# Patient Record
Sex: Female | Born: 1970 | Race: Black or African American | Hispanic: No | Marital: Single | Smoking: Never smoker
Health system: Southern US, Community
[De-identification: ages and names within clinical notes are randomized; demographics above are authoritative.]

## PROBLEM LIST (undated history)

## (undated) DIAGNOSIS — B009 Herpesviral infection, unspecified: Secondary | ICD-10-CM

## (undated) DIAGNOSIS — Z789 Other specified health status: Secondary | ICD-10-CM

## (undated) DIAGNOSIS — F329 Major depressive disorder, single episode, unspecified: Secondary | ICD-10-CM

## (undated) DIAGNOSIS — I10 Essential (primary) hypertension: Secondary | ICD-10-CM

## (undated) HISTORY — DX: Herpesviral infection, unspecified: B00.9

## (undated) HISTORY — PX: DIAGNOSTIC LAPAROSCOPY: SUR761

## (undated) HISTORY — DX: Essential (primary) hypertension: I10

## (undated) HISTORY — PX: TUBAL LIGATION: SHX77

## (undated) HISTORY — DX: Major depressive disorder, single episode, unspecified: F32.9

---

## 1997-10-29 ENCOUNTER — Ambulatory Visit (HOSPITAL_COMMUNITY): Admission: RE | Admit: 1997-10-29 | Discharge: 1997-10-29 | Payer: Self-pay | Admitting: *Deleted

## 2000-06-20 ENCOUNTER — Emergency Department (HOSPITAL_COMMUNITY): Admission: EM | Admit: 2000-06-20 | Discharge: 2000-06-20 | Payer: Self-pay | Admitting: Emergency Medicine

## 2001-12-21 ENCOUNTER — Emergency Department (HOSPITAL_COMMUNITY): Admission: EM | Admit: 2001-12-21 | Discharge: 2001-12-21 | Payer: Self-pay | Admitting: Emergency Medicine

## 2003-02-18 ENCOUNTER — Emergency Department (HOSPITAL_COMMUNITY): Admission: EM | Admit: 2003-02-18 | Discharge: 2003-02-19 | Payer: Self-pay | Admitting: Emergency Medicine

## 2003-02-19 ENCOUNTER — Encounter: Payer: Self-pay | Admitting: Emergency Medicine

## 2003-11-25 ENCOUNTER — Emergency Department (HOSPITAL_COMMUNITY): Admission: EM | Admit: 2003-11-25 | Discharge: 2003-11-26 | Payer: Self-pay | Admitting: Emergency Medicine

## 2004-03-17 ENCOUNTER — Emergency Department (HOSPITAL_COMMUNITY): Admission: EM | Admit: 2004-03-17 | Discharge: 2004-03-17 | Payer: Self-pay | Admitting: Emergency Medicine

## 2007-01-31 ENCOUNTER — Other Ambulatory Visit: Admission: RE | Admit: 2007-01-31 | Discharge: 2007-01-31 | Payer: Self-pay | Admitting: Obstetrics and Gynecology

## 2007-09-13 ENCOUNTER — Ambulatory Visit (HOSPITAL_COMMUNITY): Admission: RE | Admit: 2007-09-13 | Discharge: 2007-09-13 | Payer: Self-pay | Admitting: Obstetrics and Gynecology

## 2008-11-06 ENCOUNTER — Emergency Department (HOSPITAL_COMMUNITY): Admission: EM | Admit: 2008-11-06 | Discharge: 2008-11-06 | Payer: Self-pay | Admitting: Emergency Medicine

## 2010-01-13 ENCOUNTER — Ambulatory Visit (HOSPITAL_COMMUNITY): Admission: RE | Admit: 2010-01-13 | Discharge: 2010-01-13 | Payer: Self-pay | Admitting: Obstetrics

## 2011-04-04 ENCOUNTER — Encounter (HOSPITAL_COMMUNITY): Admission: RE | Payer: Self-pay | Source: Ambulatory Visit

## 2011-04-04 ENCOUNTER — Ambulatory Visit (HOSPITAL_COMMUNITY): Admission: RE | Admit: 2011-04-04 | Payer: Self-pay | Source: Ambulatory Visit | Admitting: Obstetrics & Gynecology

## 2011-04-04 SURGERY — ROBOTIC ASSISTED LAPAROSCOPIC VAGINAL HYSTERECTOMY LEVEL 1
Anesthesia: General

## 2013-10-16 ENCOUNTER — Ambulatory Visit: Payer: BC Managed Care – PPO | Admitting: Physician Assistant

## 2013-10-16 VITALS — BP 110/70 | HR 82 | Temp 98.6°F | Resp 16 | Ht 63.5 in | Wt 169.0 lb

## 2013-10-16 DIAGNOSIS — D259 Leiomyoma of uterus, unspecified: Secondary | ICD-10-CM

## 2013-10-16 DIAGNOSIS — B373 Candidiasis of vulva and vagina: Secondary | ICD-10-CM

## 2013-10-16 DIAGNOSIS — B3731 Acute candidiasis of vulva and vagina: Secondary | ICD-10-CM

## 2013-10-16 DIAGNOSIS — N898 Other specified noninflammatory disorders of vagina: Secondary | ICD-10-CM

## 2013-10-16 DIAGNOSIS — A6000 Herpesviral infection of urogenital system, unspecified: Secondary | ICD-10-CM

## 2013-10-16 LAB — POCT WET PREP WITH KOH
KOH Prep POC: POSITIVE
Trichomonas, UA: NEGATIVE
Yeast Wet Prep HPF POC: POSITIVE

## 2013-10-16 MED ORDER — FLUCONAZOLE 150 MG PO TABS: 150.0000 mg | ORAL_TABLET | Freq: Once | ORAL | Status: DC

## 2013-10-16 MED ORDER — METRONIDAZOLE 500 MG PO TABS: 500.0000 mg | ORAL_TABLET | Freq: Two times a day (BID) | ORAL | Status: DC

## 2013-10-16 MED ORDER — VALACYCLOVIR HCL 500 MG PO TABS: 500.0000 mg | ORAL_TABLET | Freq: Every day | ORAL | Status: DC

## 2013-10-16 NOTE — Progress Notes (Signed)
   Subjective:    Patient ID: Katie [name], female    DOB: Sep 06, 1971, 43 y.o.   MRN: [id number]  HPI 43 year old female presents for evaluation of vaginal discharge and vaginal pruritis x 2 weeks.  Had dental work done and was on a antibiotic. Has hx of yeast infections after antibiotic therapy.  Admits to intense pruritis and discomfort.  Denies abdominal pain, nausea, vomiting, dysuria, urinary frequency, fever, or chills.  She is sexually active but does not wish to be screened for STI's today.    Hx of uterine fibroids - would like to be referred to GYN for evaluation of hysterectomy.    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  Does not have any valtrex at home. Is considering suppressive therapy. Would like an rx for valtrex today.     Review of Systems  Constitutional: Negative for fever and chills.  Gastrointestinal: Negative for nausea, vomiting and abdominal pain.  Genitourinary: Positive for vaginal discharge and vaginal pain (burning/itching). Negative for dysuria, frequency, vaginal bleeding and genital sores.       Objective:   Physical Exam  Constitutional: She appears well-developed and well-nourished.  HENT:  Head: Normocephalic and atraumatic.  Right Ear: External ear normal.  Left Ear: External ear normal.  Eyes: Conjunctivae are normal.  Neck: Normal range of motion.  Cardiovascular: Normal rate, regular rhythm and normal heart sounds.   Pulmonary/Chest: Effort normal and breath sounds normal.  Abdominal: Soft. Bowel sounds are normal. There is no tenderness.  Large, palpable mass in LLQ consistent with large uterine fibroid  Genitourinary: Pelvic exam was performed with patient supine. There is no lesion on the right labia. There is no lesion on the left labia. Uterus is enlarged (large uterine fibroid left side). Cervix exhibits discharge (thick, white, clumpy). Cervix exhibits no motion tenderness and no friability. Right adnexum displays no mass, no  tenderness and no fullness. Left adnexum displays no mass, no tenderness and no fullness. No erythema around the vagina. Vaginal discharge (thick, white discharge at introitus) found.    Results for orders placed in visit on 10/16/13  POCT WET PREP WITH KOH      Result Value Range   Trichomonas, UA Negative     Clue Cells Wet Prep HPF POC 3-8     Epithelial Wet Prep HPF POC 3-10     Yeast Wet Prep HPF POC positive     Bacteria Wet Prep HPF POC 1+     RBC Wet Prep HPF POC 0-1     WBC Wet Prep HPF POC 0-3     KOH Prep POC Positive           Assessment & Plan:  Candida vaginitis - Plan: fluconazole (DIFLUCAN) 150 MG tablet  Leukorrhea, not specified as infective - Plan: POCT Wet Prep with KOH  Uterine fibroid - Plan: Ambulatory referral to Gynecology  Genital HSV - Plan: valACYclovir (VALTREX) 500 MG tablet  Will treat candidiasis with diflucan 150 mg po x 1 dose today. May repeat in 7 days Presence of clue cells indicates early BV - will treat with flagyl 500 mg bid x 7 days - no ETOH Referral placed to GYN for evaluation and treatment of large uterine fibroid Valtrex 500 mg daily if she would like to take for suppression. Increase to twice daily in presence of infection.

## 2013-10-16 NOTE — Patient Instructions (Signed)
May use OTC Gyne-Lotrimin for symptomatic relief

## 2014-01-18 ENCOUNTER — Ambulatory Visit: Payer: BC Managed Care – PPO | Admitting: Family Medicine

## 2014-01-18 ENCOUNTER — Encounter (HOSPITAL_COMMUNITY): Payer: Self-pay | Admitting: Emergency Medicine

## 2014-01-18 ENCOUNTER — Ambulatory Visit: Payer: BC Managed Care – PPO

## 2014-01-18 ENCOUNTER — Observation Stay (HOSPITAL_COMMUNITY)
Admission: EM | Admit: 2014-01-18 | Discharge: 2014-01-19 | Disposition: A | Discharge status: Home / Self Care | Payer: BC Managed Care – PPO | Attending: Family Medicine | Admitting: Family Medicine

## 2014-01-18 VITALS — BP 138/64 | HR 90 | Temp 98.2°F | Resp 18 | Ht 64.25 in | Wt 175.0 lb

## 2014-01-18 DIAGNOSIS — R5383 Other fatigue: Secondary | ICD-10-CM

## 2014-01-18 DIAGNOSIS — R5381 Other malaise: Secondary | ICD-10-CM | POA: Insufficient documentation

## 2014-01-18 DIAGNOSIS — N920 Excessive and frequent menstruation with regular cycle: Secondary | ICD-10-CM | POA: Insufficient documentation

## 2014-01-18 DIAGNOSIS — E876 Hypokalemia: Secondary | ICD-10-CM

## 2014-01-18 DIAGNOSIS — M79609 Pain in unspecified limb: Secondary | ICD-10-CM

## 2014-01-18 DIAGNOSIS — M79602 Pain in left arm: Secondary | ICD-10-CM

## 2014-01-18 DIAGNOSIS — D649 Anemia, unspecified: Secondary | ICD-10-CM | POA: Diagnosis present

## 2014-01-18 DIAGNOSIS — R079 Chest pain, unspecified: Secondary | ICD-10-CM

## 2014-01-18 DIAGNOSIS — N83209 Unspecified ovarian cyst, unspecified side: Secondary | ICD-10-CM | POA: Insufficient documentation

## 2014-01-18 DIAGNOSIS — M542 Cervicalgia: Secondary | ICD-10-CM

## 2014-01-18 DIAGNOSIS — D251 Intramural leiomyoma of uterus: Secondary | ICD-10-CM | POA: Insufficient documentation

## 2014-01-18 DIAGNOSIS — D509 Iron deficiency anemia, unspecified: Principal | ICD-10-CM | POA: Insufficient documentation

## 2014-01-18 DIAGNOSIS — R0789 Other chest pain: Secondary | ICD-10-CM | POA: Insufficient documentation

## 2014-01-18 DIAGNOSIS — D252 Subserosal leiomyoma of uterus: Secondary | ICD-10-CM | POA: Insufficient documentation

## 2014-01-18 LAB — POCT CBC
Granulocyte percent: 48.8 %G (ref 37–80)
HCT, POC: 22.4 % — AB (ref 37.7–47.9)
Hemoglobin: 6.3 g/dL — AB (ref 12.2–16.2)
Lymph, poc: 2.5 (ref 0.6–3.4)
MCH, POC: 14.7 pg — AB (ref 27–31.2)
MCHC: 28.1 g/dL — AB (ref 31.8–35.4)
MCV: 52 fL — AB (ref 80–97)
MID (cbc): 0.5 (ref 0–0.9)
POC Granulocyte: 2.9 (ref 2–6.9)
POC LYMPH PERCENT: 42.5 %L (ref 10–50)
POC MID %: 8.7 %M (ref 0–12)
Platelet Count, POC: 687 10*3/uL — AB (ref 142–424)
RBC: 4.3 M/uL (ref 4.04–5.48)
RDW, POC: 23.1 %
WBC: 5.9 10*3/uL (ref 4.6–10.2)

## 2014-01-18 LAB — BASIC METABOLIC PANEL
BUN: 9 mg/dL (ref 6–23)
CO2: 23 mEq/L (ref 19–32)
Calcium: 9 mg/dL (ref 8.4–10.5)
Chloride: 104 mEq/L (ref 96–112)
Creatinine, Ser: 0.64 mg/dL (ref 0.50–1.10)
GFR calc Af Amer: >90 mL/min (ref >90)
GFR calc non Af Amer: >90 mL/min (ref >90)
Glucose, Bld: 83 mg/dL (ref 70–99)
Potassium: 3.6 mEq/L — ABNORMAL LOW (ref 3.7–5.3)
Sodium: 139 mEq/L (ref 137–147)

## 2014-01-18 LAB — CBC WITH DIFFERENTIAL/PLATELET
Basophils Absolute: 0.1 10*3/uL (ref 0.0–0.1)
Basophils Relative: 1 % (ref 0–1)
Eosinophils Absolute: 0.1 10*3/uL (ref 0.0–0.7)
Eosinophils Relative: 3 % (ref 0–5)
HCT: 20.5 % — ABNORMAL LOW (ref 36.0–46.0)
Hemoglobin: 5.5 g/dL — CL (ref 12.0–15.0)
Lymphocytes Relative: 40 % (ref 12–46)
Lymphs Abs: 1.7 10*3/uL (ref 0.7–4.0)
MCH: 14.8 pg — ABNORMAL LOW (ref 26.0–34.0)
MCHC: 26.8 g/dL — ABNORMAL LOW (ref 30.0–36.0)
MCV: 55.3 fL — ABNORMAL LOW (ref 78.0–100.0)
Monocytes Absolute: 0.3 10*3/uL (ref 0.1–1.0)
Monocytes Relative: 8 % (ref 3–12)
Neutro Abs: 2 10*3/uL (ref 1.7–7.7)
Neutrophils Relative %: 47 % (ref 43–77)
Platelets: 384 10*3/uL (ref 150–400)
RBC: 3.71 MIL/uL — ABNORMAL LOW (ref 3.87–5.11)
RDW: 21.9 % — ABNORMAL HIGH (ref 11.5–15.5)
WBC: 4.2 10*3/uL (ref 4.0–10.5)

## 2014-01-18 LAB — URINALYSIS, ROUTINE W REFLEX MICROSCOPIC
Bilirubin Urine: NEGATIVE
Glucose, UA: NEGATIVE mg/dL
Hgb urine dipstick: NEGATIVE
Ketones, ur: NEGATIVE mg/dL
Leukocytes, UA: NEGATIVE
Nitrite: NEGATIVE
Protein, ur: NEGATIVE mg/dL
Specific Gravity, Urine: 1.03 (ref 1.005–1.030)
Urobilinogen, UA: 1 mg/dL (ref 0.0–1.0)
pH: 5.5 (ref 5.0–8.0)

## 2014-01-18 LAB — I-STAT TROPONIN, ED: Troponin i, poc: 0 ng/mL (ref 0.00–0.08)

## 2014-01-18 LAB — PREPARE RBC (CROSSMATCH)

## 2014-01-18 LAB — PROTIME-INR
INR: 1.07 (ref 0.00–1.49)
Prothrombin Time: 13.7 seconds (ref 11.6–15.2)

## 2014-01-18 LAB — POCT SEDIMENTATION RATE: POCT SED RATE: 40 mm/hr — AB (ref 0–22)

## 2014-01-18 LAB — RETICULOCYTES
RBC.: 4.27 MIL/uL (ref 3.87–5.11)
Retic Count, Absolute: 68.3 10*3/uL (ref 19.0–186.0)
Retic Ct Pct: 1.6 % (ref 0.4–3.1)

## 2014-01-18 LAB — APTT: aPTT: 29 seconds (ref 24–37)

## 2014-01-18 LAB — ABO/RH: ABO/RH(D): O POS

## 2014-01-18 LAB — POC URINE PREG, ED: Preg Test, Ur: NEGATIVE

## 2014-01-18 LAB — POC OCCULT BLOOD, ED: Fecal Occult Bld: NEGATIVE

## 2014-01-18 MED ORDER — FOLIC ACID 1 MG PO TABS
1.0000 mg | ORAL_TABLET | Freq: Every day | ORAL | Status: DC
  Administered 2014-01-18 – 2014-01-19 (×2): 1 mg via ORAL
  Filled 2014-01-18 (×2): qty 1

## 2014-01-18 MED ORDER — VITAMIN B-1 100 MG PO TABS
100.0000 mg | ORAL_TABLET | Freq: Every day | ORAL | Status: DC
  Administered 2014-01-18 – 2014-01-19 (×2): 100 mg via ORAL
  Filled 2014-01-18 (×2): qty 1

## 2014-01-18 MED ORDER — ONDANSETRON HCL 4 MG PO TABS: 4.0000 mg | ORAL_TABLET | Freq: Four times a day (QID) | ORAL | Status: DC | PRN

## 2014-01-18 MED ORDER — ONDANSETRON HCL 4 MG/2ML IJ SOLN: 4.0000 mg | Freq: Four times a day (QID) | INTRAMUSCULAR | Status: DC | PRN

## 2014-01-18 MED ORDER — ADULT MULTIVITAMIN W/MINERALS CH
1.0000 | ORAL_TABLET | Freq: Every day | ORAL | Status: DC
  Administered 2014-01-18 – 2014-01-19 (×2): 1 via ORAL
  Filled 2014-01-18 (×2): qty 1

## 2014-01-18 MED ORDER — POLYETHYLENE GLYCOL 3350 17 G PO PACK
17.0000 g | PACK | Freq: Every day | ORAL | Status: DC | PRN
  Filled 2014-01-18: qty 1

## 2014-01-18 NOTE — ED Notes (Signed)
Pt. Stated, I went to an [facility] because I had some arm pain and they did EKG, xrays and blood work and told me I needed to come here because my Blood work was abnormal that I needed to get it checked out.blood work showed Hgb. 6.3

## 2014-01-18 NOTE — ED Provider Notes (Signed)
Medical screening examination/treatment/procedure(s) were performed by non-physician practitioner and as supervising physician I was immediately available for consultation/collaboration.   EKG Interpretation None        Osvaldo Shipper, MD 01/18/14 1932

## 2014-01-18 NOTE — ED Provider Notes (Signed)
Medical screening examination/treatment/procedure(s) were conducted as a shared visit with non-physician practitioner(s) and myself.  I personally evaluated the patient during the encounter.   EKG Interpretation None      Patient here with fatigue - blood work from urgent care showed anemia. Hx of heavy periods. Here with stable vitals, benign exam. Admitted.  Please excuse prior note, I did see patient and examine her myself.  Osvaldo Shipper, MD 01/18/14 [phone]

## 2014-01-18 NOTE — H&P (Signed)
[facility] Admission History and Physical Service Pager: [phone]  Patient name: Katie [name] Medical record number: [id number] Date of birth: 1971/06/11 Age: 43 y.o. Gender: female  Primary Care Provider: No PCP Per Patient Consultants: none Code Status: Full   Chief Complaint: weakness/anemia   Assessment and Plan: Katie [name] is a 43 y.o. female presenting with weakness found to be anemic Hgb 5.5. PMH is significant for HSV2 and anemia.   #Microcytic anemia/Fibroid uterus: patient with weakness and Hgb 5.5. No known history of prior drop in hgb. History of fibroids and heavy menses. LMP a few weeks ago. Most recent transvaginal US (01/13/2010) showing multiple fibroids with the largest being 7.1 x 6.1 x 7.1 cm. She is without any hemoptysis, hematochezia, melena or chronic NSAID use. No increase in reticulocytes. FOBT (-) and no tachycardia. Urine pregnancy (-) and UA normal. istat troponin (-). Sed rate 40. Anemia most likely related to uterine bleeding.  - admitted to telemetry, Dr. Gwendlyn Deutscher attending  - Anemia panel: pending (acquired prior to transfusion)  - 2 U PRBCs  - type and screen  - post transfusion CBC  - PT/INR - APTT  - transvaginal US, can consider pelvis CT if needed for better visualization of fibroids - thiamine, B12, MVI  - Needs outpatient gyn appointment to discuss treatment for fibroids  #Hypokalemia: noticed on BMP. Will follow for now.  - kdur 40 mEq x 1  - BMP Am   #Chest pain: atypical chest pain. Improved since admission. istat troponin (-) and EKG NSR.  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  - valcyclovir for suppression.   FEN/GI: regular diet, saline lock  Prophylaxis: SCD's   Disposition: admitted to family medicine teaching service for anemia, Dr. Gwendlyn Deutscher attending   History of Present Illness: Katie [name] is a 43 y.o. female presenting with weakness  and chest pain.  She was has been feeling weaker than normal but had some chest pain today and went to [facility] urgent care. She was found to be anemic to 6.3 and sent to [facility] ED.  She has a history of fibroids.  Her last LMP ended this past Wednesday and it usually lasts 8-9 days.  She will have to wear 4-5 heavy pads per day during her cycle.  She started having menses at age 46.  Her cycle started getting heavier when she was in her thirties.  She denies any bleeding per rectum and not spitting or vomiting up blood.  She was being evaluated to have a robotic hysterectomy but didn't want a robotic procedure.  She has weakness and fatigue at baseline.   She was having pain in left arm and shoulder.  Chest pain started last night. It started out as shooting pain in leg and left side of shoulder and down her arm. Her left arm was numb and tingly. The pain didn't stop throughout this morning. The pain was not related to pain or exertion.  The chest pain like a heavy pressure.  She denies nausea or diaphoresis.  She has had some nights with night sweats for the past 6 monthns.   ED course: UA only cloudy and CXR with no active disease.   Review Of Systems: Per HPI with the following additions: See HPI  Otherwise 12 point review of systems was performed and was unremarkable.  Patient Active Problem List   Diagnosis Date Noted  . Anemia 01/18/2014  Past Medical History: Past Medical History  Diagnosis Date  . Herpes    Past Surgical History: Past Surgical History  Procedure Laterality Date  . Tubal ligation     Social History: History  Substance Use Topics  . Smoking status: Never Smoker   . Smokeless tobacco: Not on file  . Alcohol Use: Yes   Additional social history: none Please also refer to relevant sections of EMR.  Family History: Family History  Problem Relation Age of Onset  . Cancer Mother   . Cancer Father   . Cancer Brother   . Cancer Paternal Grandmother    Allergies and  Medications: No Known Allergies No current facility-administered medications on file prior to encounter.   No current outpatient prescriptions on file prior to encounter.    Objective: BP 124/66  Pulse 88  Temp(Src) 98.3 F (36.8 C) (Oral)  Resp 21  SpO2 100%  LMP 12/22/2013 Exam: General: NAD, cooperative with exam, African [place] female  HEENT: oropharynx clear, PERRL, EOMI, pale conjunctiva  Cardiovascular: S1S2, RRR, no murmurs, rubs or gallops  Respiratory: Clear breath sounds , no extra effort of breathing, no wheezing  MSK: no pain upon palpation  Abdomen: soft, NTND, +BS, no HSM, enlarged uterus palpated with abnormal contours due to fibroids Extremities: moves all freely, delayed capillary refill  Skin: no rashes, pallor  Neuro: alert and oriented, no gross deficits.   Labs and Imaging: CBC BMET   Recent Labs Lab 01/18/14 1446  WBC 4.2  HGB 5.5*  HCT 20.5*  PLT 384    Recent Labs Lab 01/18/14 1446  NA 139  K 3.6*  CL 104  CO2 23  BUN 9  CREATININE 0.64  GLUCOSE 83  CALCIUM 9.0     Urinalysis    Component Value Date/Time   COLORURINE YELLOW 01/18/2014 1521   APPEARANCEUR CLOUDY* 01/18/2014 1521   LABSPEC 1.030 01/18/2014 1521   PHURINE 5.5 01/18/2014 1521   GLUCOSEU NEGATIVE 01/18/2014 1521   [place] 01/18/2014 1521   [place] 01/18/2014 1521   [place] 01/18/2014 1521   PROTEINUR NEGATIVE 01/18/2014 1521   UROBILINOGEN 1.0 01/18/2014 1521   NITRITE NEGATIVE 01/18/2014 1521   LEUKOCYTESUR NEGATIVE 01/18/2014 1521   Urine culture: pending   Urine preg (-)   EKG: NSR   CXR: No acute cardiopulmonary disease.    Results for TRACEY, HERMANCE (MRN [id number]) as of 01/18/2014 17:51  Ref. Range 01/18/2014 15:27  RBC. Latest Range: 3.87-5.11 MIL/uL 4.27  Retic Ct Pct Latest Range: 0.4-3.1 % 1.6  Retic Count, Manual Latest Range: 19.0-186.0 K/uL 68.3    Rosemarie Ax, MD 01/18/2014, 4:51 PM PGY-1, [place] Intern pager: [phone], text pages welcome  PGY-3 Addendum: Patient seen and examined. I agree with Dr. Doristine Locks note above with additional changes noted in purple. Will begin blood transfusion 2U PRBC and monitor H/H for response. Patient agrees with plan.  Hayzel Ruberg M. Agatha Duplechain, M.D. 01/18/2014 7:07 PM

## 2014-01-18 NOTE — Patient Instructions (Signed)
Go directly to [facility] Emergency room for evaluation of your low blood count (anemia), and further evaluation of your left arm/neck/upper chest pain. After their treatment, if follow up needed with primary care - I am here this week. Return to the clinic or go to the nearest emergency room if any of your symptoms worsen or new symptoms occur.

## 2014-01-18 NOTE — ED Provider Notes (Signed)
CSN: [id number]     Arrival date & time 01/18/14  48 History   First MD Initiated Contact with Patient 01/18/14 1501     Chief Complaint  Patient presents with  . Weakness     (Consider location/radiation/quality/duration/timing/severity/associated sxs/prior Treatment) HPI Comments: Patient is a 43 year old female with history of fibroids who presents today from [place]. She was evaluated there for generalized weakness and chest pain. She states that her weakness has been gradually worsening over the past few months. She has had worsening DOE for the past few months as well. She complains of foot and chest pain which started after wearing wedge shoes. She normally wears flat shoes, but 2 days ago wore the wedges. The next morning she began to have shooting pains in her foot that lasted minutes at a time, worse with palpation and movement. She currently is experiencing chest tightness. That has been constant since yesterday. No history of cardiac disease. No family history of early heart disease. No hypertension, hyperlipidemia. She has never smoked. She has never required a transfusion in the past.   Patient is a 43 y.o. female presenting with weakness. The history is provided by the patient. No language interpreter was used.  Weakness Associated symptoms include chest pain, myalgias and weakness. Pertinent negatives include no abdominal pain, chills, fever, nausea or vomiting.    Past Medical History  Diagnosis Date  . Herpes    Past Surgical History  Procedure Laterality Date  . Tubal ligation     Family History  Problem Relation Age of Onset  . Cancer Mother   . Cancer Father   . Cancer Brother   . Cancer Paternal Grandmother    History  Substance Use Topics  . Smoking status: Never Smoker   . Smokeless tobacco: Not on file  . Alcohol Use: Yes   OB History   Grav Para Term Preterm Abortions TAB SAB Ect Mult Living                 Review of Systems  Constitutional:  Negative for fever and chills.  Respiratory: Positive for chest tightness and shortness of breath.   Cardiovascular: Positive for chest pain.  Gastrointestinal: Negative for nausea, vomiting and abdominal pain.  Genitourinary: Negative for vaginal bleeding.  Musculoskeletal: Positive for myalgias.  Neurological: Positive for weakness.  All other systems reviewed and are negative.     Allergies  Review of patient's allergies indicates no known allergies.  Home Medications   Prior to Admission medications   Medication Sig Start Date End Date Taking? Authorizing Provider  Cyanocobalamin (VITAMIN B 12 PO) Take 2 tablets by mouth daily.   Yes Historical Provider, MD  naproxen sodium (ANAPROX) 220 MG tablet Take 440 mg by mouth 3 (three) times daily as needed.   Yes Historical Provider, MD   BP 129/65  Pulse 86  Temp(Src) 98.3 F (36.8 C) (Oral)  Resp 18  SpO2 96%  LMP 12/22/2013 Physical Exam  Nursing note and vitals reviewed. Constitutional: She is oriented to person, place, and time. She appears well-developed and well-nourished. No distress.  HENT:  Head: Normocephalic and atraumatic.  Right Ear: External ear normal.  Left Ear: External ear normal.  Nose: Nose normal.  Mouth/Throat: Oropharynx is clear and moist.  Eyes: Conjunctivae are normal.  Neck: Normal range of motion.  Cardiovascular: Normal rate, regular rhythm and normal heart sounds.   Pulmonary/Chest: Effort normal and breath sounds normal. No stridor. No respiratory distress. She has  no wheezes. She has no rales.    Abdominal: Soft. She exhibits no distension.  Musculoskeletal: Normal range of motion.  Neurological: She is alert and oriented to person, place, and time. She has normal strength.  Skin: Skin is warm and dry. She is not diaphoretic. No erythema. There is pallor.  Psychiatric: She has a normal mood and affect. Her behavior is normal.    ED Course  Procedures (including critical care  time) Labs Review Labs Reviewed  CBC WITH DIFFERENTIAL - Abnormal; Notable for the following:    RBC 3.71 (*)    Hemoglobin 5.5 (*)    HCT 20.5 (*)    MCV 55.3 (*)    MCH 14.8 (*)    MCHC 26.8 (*)    RDW 21.9 (*)    All other components within normal limits  BASIC METABOLIC PANEL - Abnormal; Notable for the following:    Potassium 3.6 (*)    All other components within normal limits  URINALYSIS, ROUTINE W REFLEX MICROSCOPIC - Abnormal; Notable for the following:    APPearance CLOUDY (*)    All other components within normal limits  URINE CULTURE  RETICULOCYTES  OCCULT BLOOD X 1 CARD TO LAB, STOOL  VITAMIN B12  FOLATE  IRON AND TIBC  FERRITIN  PROTIME-INR  APTT  CBC  BASIC METABOLIC PANEL  I-STAT TROPOININ, ED  POC URINE PREG, ED  POC OCCULT BLOOD, ED  PREPARE RBC (CROSSMATCH)  TYPE AND SCREEN  ABO/RH    Imaging Review Dg Chest 2 View  01/18/2014   CLINICAL DATA:  upper chest to L arm pain.  EXAM: CHEST - 2 VIEW  COMPARISON:  None available  FINDINGS: Lungs are clear. Heart size and mediastinal contours are within normal limits. No effusion. Visualized skeletal structures are unremarkable.  IMPRESSION: No acute cardiopulmonary disease.   Electronically Signed   By: Arne Cleveland M.D.   On: 01/18/2014 13:07   Dg Cervical Spine 2 Or 3 Views  01/18/2014   CLINICAL DATA:  L arm pain, neck pain on left.  EXAM: CERVICAL SPINE - 2-3 VIEW  COMPARISON:  None.  FINDINGS: Reversal of the normal cervical lordosis. No prevertebral soft tissue swelling. Negative for fracture. Mild narrowing of the C6-7 interspace. Anterior and posterior endplate spurring D9-I3.  IMPRESSION: 1. Negative for fracture or other acute bone abnormality. 2. 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 3. Loss of the normal cervical spine lordosis, which may be secondary to positioning, spasm, or soft tissue injury.   Electronically Signed   By: Arne Cleveland M.D.   On: 01/18/2014 13:08     EKG  Interpretation None      MDM   Final diagnoses:  Anemia    Patient presents to ED for evaluation of anemia from [facility]. Patient with hemoglobin of 5.5. I think likely source is vaginal bleeding from known fibroids, but anemia panel ordered. She has never had this issue in the past. She has never required a transfusion in the past. Hemoccult negative. Patient is hemodynamically stable. Appropriate for tele. Admission is appreciated. Dr. Mingo Amber evaluated patient and agrees with plan. Patient / Family / Caregiver informed of clinical course, understand medical decision-making process, and agree with plan.     Elwyn Lade, PA-C 01/18/14 1806

## 2014-01-18 NOTE — H&P (Signed)
FMTS ATTENDING ADMISSION NOTE Katie Eniola,MD I  have seen and examined this patient, reviewed their chart. I have discussed this patient with the resident. I agree with the resident's findings, assessment and care plan.  43 Y/O F with PMX of large fibroid,menorraghia, was sent to the hospital from urgent care due to hx of left sided chest pain which started last night radiating to her left arm. She stated the pain started from her left feet like a cramp and while trying to massage her feet the pain shoot up to her chest and arm. Pain has improved now. She denies any dizziness but has chronic fatigue for months which she attributed to stress from work. Patient endorsed heavy menstrual bleeding with clots every months and her menses last 8-9 days every months, this she attributed to her large fibroid. She denies any GI bleed,no blood in stool. No other GI symptoms. She denies anemia but due to her heavy monthly menstrual bleed she started on ferrous sulphate on and off. She endorsed family hx of leukemia in her mother and lymphoma in her father.  No current facility-administered medications on file prior to encounter.   No current outpatient prescriptions on file prior to encounter.   Past Medical History  Diagnosis Date  . Herpes    Filed Vitals:   01/18/14 1522 01/18/14 1744 01/18/14 1834 01/18/14 1903  BP: 124/66 116/59 119/76 108/71  Pulse: 88 72 85 74  Temp:  98.2 F (36.8 C) 98 F (36.7 C) 98.4 F (36.9 C)  TempSrc:  Oral Oral Oral  Resp: 21 16 16 16   SpO2: 100% 100% 99% 100%   Exam: Gen: Calm in bed,in no distress. HEENT: + conjunctival palor,EOMI, PERRLA. Resp:Air entry equal B/L,no wheezing. CV: S1 S2 normal,no murmur. Abdomen: NT/ND,BS+, palpable suprapubic mass more located on the right ( fibroid) Ext: No edema.  A/P: 43 Y/O female with         1. severe anemia secondary to menorrhagia from fibroid.            No acute bleed, this is likely chronic hence patient  presenting with fatigue and no fainting.            Anemia panel.            Type and screen and transfuse 2 unit PRBC.            Post transfusion CBC            Irone supplement recommended.       2. Chest pain likely due to cardia demand from severe anemia.          Symptom gradually resolving.          EKG non specific t wave changes.          iStat troponin negative.          Monitor cardiac activity on telemetry.          Cycle troponin.          Repeat EKG in am.          Consider cards consult depending on result and symptoms.  3. Fibroid: Contributing her anemia.     Pelvic U/S to assess size or other uterine pathology that can potentially cause bleeding.     Patient will need Gyn follow up upon d/c from the hospital.  4. Electrolyte inbalance, replace electrolyte and monitor.

## 2014-01-18 NOTE — Progress Notes (Addendum)
Subjective:  This chart was scribed for Katie Ray, MD by Roxan Diesel, Scribe.  This patient was seen in [place] [facility] and the patient's care was started at 11:43 AM.   Patient ID: Katie [name], female    DOB: 12-Dec-1970, 43 y.o.   MRN: [id number]  HPI  Katie [name] is a 43 y.o. female PCP: No PCP Per Patient   Pt presents with a complaint of left-sided chest and left arm pain that began last night at 12 AM.  Pt reports she has had some numbness and tingling in her feet for the past 2 days since she "wore the wrong shoes" and last night she was stretching her left foot when she developed sudden onset of "sharp" pain shooting up her left leg, left side and chest, and down her left arm.  Her leg pain has since resolved but she has continued to have constant "sharp" pain and a feeling of "heaviness" in her left arm, shoulder and frontal left chest area.  She also reports the left side of her neck feels "stiff and tight."  She states the pain shoots down her arm into the fingers but denies numbness in arm or finger tips.  She denies associated nausea, vomiting, SOB, headache, or diaphoresis.  She has attempted to treat pain with 3 Aleve tablets (last taken 1 hour ago), with mild relief.  Pt admits to prior h/o occasional neck pain but denies prior h/o chest pain or pressure.  She notes that for the past several months she has occasionally become SOB when going up steps, "but that's just because I'm out of shape."  She denies associated CP or pressure at those times and denies any SOB associated with her current pain.  She does report recent increased stress due to working 2 jobs and "trying to maintain everything."  She reports occasional dysphoric mood and mild depression.  She declines to speak with a counselor.  She denies recent long-distance travel.  She denies recent calf swelling or pain.  She denies h/o HTN.  She does not smoke.  She denies family h/o heart disease.    There are  no active problems to display for this patient.   Past Medical History  Diagnosis Date  . Herpes     Past Surgical History  Procedure Laterality Date  . Tubal ligation      No Known Allergies  Prior to Admission medications   Medication Sig Start Date End Date Taking? Authorizing Provider  valACYclovir (VALTREX) 500 MG tablet Take 1 tablet (500 mg total) by mouth daily. Increase to twice daily if outbreak occurs 10/16/13   Collene Leyden, PA-C    History   Social History  . Marital Status: Single    Spouse Name: N/A    Number of Children: N/A  . Years of Education: N/A   Occupational History  . Not on file.   Social History Main Topics  . Smoking status: Never Smoker   . Smokeless tobacco: Not on file  . Alcohol Use: Not on file  . Drug Use: Not on file  . Sexual Activity: Not on file   Other Topics Concern  . Not on file   Social History Narrative  . No narrative on file     Review of Systems  Constitutional: Positive for fatigue. Negative for diaphoresis.  Respiratory: Positive for shortness of breath (with going up steps over the past few months, none with current pain).   Cardiovascular: Positive  for chest pain. Negative for leg swelling.  Gastrointestinal: Negative for nausea and vomiting.  Musculoskeletal: Positive for neck pain.       Left arm pain Left leg pain (last night, since resolved) Foot pain No calf pain  Neurological: Positive for light-headedness. Negative for numbness and headaches.  Psychiatric/Behavioral: Positive for dysphoric mood.        Objective:   Physical Exam  Vitals reviewed. Constitutional: She is oriented to person, place, and time. She appears well-developed and well-nourished.  HENT:  Head: Normocephalic and atraumatic.  Eyes: Conjunctivae and EOM are normal. Pupils are equal, round, and reactive to light.  Neck: Carotid bruit is not present.  Cardiovascular: Normal rate, regular rhythm, normal heart sounds and  intact distal pulses.   Pulmonary/Chest: Effort normal and breath sounds normal.  Abdominal: Soft. She exhibits no pulsatile midline mass. There is no tenderness.  Musculoskeletal:  Pain is somewhat reproducible with pressure along the left arm, and somewhat along the left chest and upper back and neck area.  Unchanged with ROM of the neck.  Neurological: She is alert and oriented to person, place, and time. No cranial nerve deficit.  Reflex Scores:      Tricep reflexes are 2+ on the right side and 2+ on the left side.      Bicep reflexes are 2+ on the right side and 2+ on the left side.      Brachioradialis reflexes are 2+ on the right side and 2+ on the left side. Negative Tinel's at the neck Strength equal in right and left arms  Skin: Skin is warm and dry.  Psychiatric: She has a normal mood and affect. Her behavior is normal.     Filed Vitals:   01/18/14 1122  BP: 138/64  Pulse: 90  Temp: 98.2 F (36.8 C)  TempSrc: Oral  Resp: 18  Height: 5' 4.25" (1.632 m)  Weight: 175 lb (79.379 kg)  SpO2: 99%  Body mass index is 29.8 kg/(m^2).   Results for orders placed in visit on 01/18/14  POCT CBC      Result Value Ref Range   WBC 5.9  4.6 - 10.2 K/uL   Lymph, poc 2.5  0.6 - 3.4   POC LYMPH PERCENT 42.5  10 - 50 %L   MID (cbc) 0.5  0 - 0.9   POC MID % 8.7  0 - 12 %M   POC Granulocyte 2.9  2 - 6.9   Granulocyte percent 48.8  37 - 80 %G   RBC 4.30  4.04 - 5.48 M/uL   Hemoglobin 6.3 (*) 12.2 - 16.2 g/dL   HCT, POC 22.4 (*) 37.7 - 47.9 %   MCV 52.0 (*) 80 - 97 fL   MCH, POC 14.7 (*) 27 - 31.2 pg   MCHC 28.1 (*) 31.8 - 35.4 g/dL   RDW, POC 23.1     Platelet Count, POC 687 (*) 142 - 424 K/uL   MPV    0 - 99.8 fL   Reviewed above with patient, hx of fibroids, clots and heavy periods.  LMP 4/26- 5/5. 1 week menses usually, no known hx of anemia. Some fatigue over time with work and shortness of breath with walking stairs, but no recent changes. Denies near syncope or increased  lightheadedness.  Hx low vit D in past - no recent meds.  Not told anemic or iron deficient in past.   [facility] reading (PRIMARY) by  Dr. 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      Assessment & Plan:  Katie [name] is a 43 y.o. female Chest pain Neck pain on left side Left arm pain - Plan: DG Cervical Spine 2 or 3 views  -some reproducibility with exam, and notable ddd of cervical spine. No known cardiac risk factors, but with anemia as below, will have further eval in emergency room today.  Discussed EMS transport, but this was declined and friend will be driving to ER. Charge nurse advised.   Anemia - microcytic, suspect iron deficiency from menstrual losses. Likely cause of fatigue and dyspnea with stairs. eval in ER for possible transfusion as above.   Other malaise and fatigue - as above.   Also some stress component likely with two jobs, and some anhedonia noted. Plan to discuss this further at follow up - recommended recheck with me this week after hospital eval.    RTC precautions.   No orders of the defined types were placed in this encounter.   Patient Instructions  Go directly to [facility] Emergency room for evaluation of your low blood count (anemia), and further evaluation of your left arm/neck/upper chest pain. After their treatment, if follow up needed with primary care - I am here this week. Return to the clinic or go to the nearest emergency room if any of your symptoms worsen or new symptoms occur.      I personally performed the services described in this documentation, which was scribed in my presence. The recorded information has been reviewed and considered, and addended by me as needed.

## 2014-01-19 ENCOUNTER — Observation Stay (HOSPITAL_COMMUNITY): Payer: BC Managed Care – PPO

## 2014-01-19 LAB — IRON AND TIBC
Iron: 14 ug/dL — ABNORMAL LOW (ref 42–135)
UIBC: >575 ug/dL — ABNORMAL HIGH (ref 125–400)

## 2014-01-19 LAB — URINE CULTURE: Colony Count: >=100000

## 2014-01-19 LAB — CBC
HCT: 28.6 % — ABNORMAL LOW (ref 36.0–46.0)
Hemoglobin: 8.4 g/dL — ABNORMAL LOW (ref 12.0–15.0)
MCH: 18 pg — ABNORMAL LOW (ref 26.0–34.0)
MCHC: 29.4 g/dL — ABNORMAL LOW (ref 30.0–36.0)
MCV: 61.2 fL — ABNORMAL LOW (ref 78.0–100.0)
Platelets: 472 10*3/uL — ABNORMAL HIGH (ref 150–400)
RBC: 4.67 MIL/uL (ref 3.87–5.11)
RDW: 29.5 % — ABNORMAL HIGH (ref 11.5–15.5)
WBC: 4 10*3/uL (ref 4.0–10.5)

## 2014-01-19 LAB — FERRITIN: Ferritin: <1 ng/mL — ABNORMAL LOW (ref 10–291)

## 2014-01-19 LAB — BASIC METABOLIC PANEL
BUN: 9 mg/dL (ref 6–23)
CO2: 20 mEq/L (ref 19–32)
Calcium: 9.1 mg/dL (ref 8.4–10.5)
Chloride: 104 mEq/L (ref 96–112)
Creatinine, Ser: 0.64 mg/dL (ref 0.50–1.10)
GFR calc Af Amer: >90 mL/min (ref >90)
GFR calc non Af Amer: >90 mL/min (ref >90)
Glucose, Bld: 90 mg/dL (ref 70–99)
Potassium: 4.4 mEq/L (ref 3.7–5.3)
Sodium: 139 mEq/L (ref 137–147)

## 2014-01-19 LAB — VITAMIN B12: Vitamin B-12: 886 pg/mL (ref 211–911)

## 2014-01-19 LAB — FOLATE: Folate: 15 ng/mL

## 2014-01-19 LAB — TROPONIN I
Troponin I: <0.3 ng/mL (ref <0.30)
Troponin I: <0.3 ng/mL (ref <0.30)

## 2014-01-19 MED ORDER — FERROUS SULFATE 325 (65 FE) MG PO TABS: 325.0000 mg | ORAL_TABLET | Freq: Every day | ORAL | Status: DC

## 2014-01-19 MED ORDER — FERROUS SULFATE 325 (65 FE) MG PO TABS
325.0000 mg | ORAL_TABLET | Freq: Every day | ORAL | Status: DC
  Filled 2014-01-19: qty 1

## 2014-01-19 MED ORDER — FOLIC ACID 1 MG PO TABS: 1.0000 mg | ORAL_TABLET | Freq: Every day | ORAL | Status: DC

## 2014-01-19 NOTE — Progress Notes (Signed)
Pt signed d/c papers, IV removed. Instructions given on activity, medications, and when to return. Info on follow up appointments given. Pt escorted off unit accompanied by NT.

## 2014-01-19 NOTE — Progress Notes (Signed)
Family Medicine Teaching Service Daily Progress Note Intern Pager: [phone]  Patient name: Katie [name] Medical record number: [id number] Date of birth: Jul 21, 1971 Age: 43 y.o. Gender: female  Primary Care Provider: No PCP Per Patient Consultants: none Code Status: Full   Pt Overview and Major Events to Date:  5/10: admitted for weakness and anemia.   ABX/Culture Urine cx 5/10>>  Assessment and Plan: Katie [name] is a 43 y.o. female presenting with weakness found to be anemic Hgb 5.5. PMH is significant for HSV2 and anemia.   #Microcytic anemia/Menorrhagia: Found to have iron deficiency anemia. Feeling better today post transfusion.  - B12: 886 - folate: 15 - iron 14 low  - ferritin 1 low  - PT/INR: 13.7/1.07 - APTT: 29 - thiamine, B12, MVI  - Needs outpatient gyn appointment to discuss treatment for fibroids   #Fibroid uterus: Awaiting ultrasound today.   - PT/INR: 13.7/1.07 - APTT: 29 - transvaginal US, can consider pelvis CT if needed for better visualization of fibroids   #Hypokalemia: Resolved this am   #Chest pain: atypical chest pain. Improved since admission. istat troponin (-) and EKG NSR.  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  - valcyclovir for suppression.   FEN/GI: regular diet, saline lock  Prophylaxis: SCD's   Disposition: pending improvement   Subjective:  Feeling better this morning with no problems during transfusion.   Objective: Temp:  [97.5 F (36.4 C)-98.4 F (36.9 C)] 98 F (36.7 C) (05/11 0547) Pulse Rate:  [68-90] 70 (05/11 0547) Resp:  [14-21] 16 (05/11 0547) BP: (104-138)/(59-85) 104/69 mmHg (05/11 0547) SpO2:  [96 %-100 %] 99 % (05/11 0547) Weight:  [175 lb (79.379 kg)-178 lb 5.6 oz (80.9 kg)] 178 lb 5.6 oz (80.9 kg) (05/10 2027) Physical Exam: General: NAD, cooperative with exam, African [place] female   Cardiovascular: S1S2, RRR, no murmurs, rubs or gallops   Respiratory: Clear breath sounds , no extra effort of breathing, no wheezing  Extremities: moves all freely,   Skin: no rashes, pallor  Neuro: alert and oriented, no gross deficits.   Laboratory:  Recent Labs Lab 01/18/14 1446 01/19/14 0535  WBC 4.2 4.0  HGB 5.5* 8.4*  HCT 20.5* 28.6*  PLT 384 472*    Recent Labs Lab 01/18/14 1446 01/19/14 0535  NA 139 139  K 3.6* 4.4  CL 104 104  CO2 23 20  BUN 9 9  CREATININE 0.64 0.64  CALCIUM 9.0 9.1  GLUCOSE 83 90      Recent Labs Lab 01/18/14 2333 01/19/14 0535  TROPONINI <0.30 <0.30   Imaging/Diagnostic Tests:   Rosemarie Ax, MD 01/19/2014, 9:51 AM PGY-1, [place] Intern pager: [phone], text pages welcome

## 2014-01-19 NOTE — Progress Notes (Signed)
FMTS Attending Daily Note:  Jeff Malicia Blasdel MD  [phone] pager  Family Practice pager:  [phone] I have discussed this patient with the resident Dr. Schmitz. I agree with their findings, assessment, and care plan  

## 2014-01-19 NOTE — Discharge Summary (Signed)
[facility] Discharge Summary  Patient name: Katie [name] Medical record number: [id number] Date of birth: [birth year] Age: 43 y.o. Gender: female Date of Admission: 01/18/2014  Date of Discharge:01/19/14 Admitting Physician: Andrena Mews, MD  Primary Care Provider: No PCP Per Patient Consultants: none  Indication for Hospitalization: anemia/weakness  Discharge Diagnoses/Problem List:  Patient Active Problem List   Diagnosis Date Noted  . Anemia 01/18/2014  . Chest pain 01/18/2014  . Hypokalemia 01/18/2014    Disposition: home   Discharge Condition: improved   Discharge Exam:  General: NAD, cooperative with exam, African [place] female  Cardiovascular: S1S2, RRR, no murmurs, rubs or gallops  Respiratory: Clear breath sounds , no extra effort of breathing, no wheezing  Extremities: moves all freely,  Skin: no rashes, pallor  Neuro: alert and oriented, no gross deficits.  Brief Hospital Course:  Katie [name] is a 43 y.o. female presenting with weakness found to be anemic Hgb 5.5. PMH is significant for HSV2 and anemia.   #Microcytic anemia/Menorrhagia: Patient feeling weak and seen at [facility]. She reports a history of menorrhagia and fibroids. CBC revealed she was anemic to Hgb 6.3. She was transferred to [facility] ED where her Hgb 5.5. She was found to be low in iron (14) and ferritin (<1) but normal folate and B12. She was transfused 2 U PRBC's and Hgb 8.4 after. A transvaginal ultrasound showed enlarging fibroids from 2011. She was discharged with daily iron and the recommendation to follow up with GYN.    #Fibroid uterus: Awaiting ultrasound today.  - PT/INR: 13.7/1.07  - APTT: 29  - transvaginal US, can consider pelvis CT if needed for better visualization of fibroids   #Chest pain: She reported atypical chest pain. Improved since admission. Troponin's were cycled to be negative and EKG NSR.    #HSV2 infection: Stable with last  episode greater than 1 year ago. She uses valcyclovir for suppression   Issues for Follow Up:  1. Placed on daily iron pill for anemia.  2. Fibroids: patient needs f/u with GYN for   Significant Procedures: transvaginal U/S  Significant Labs and Imaging:   Recent Labs Lab 01/18/14 1446 01/19/14 0535  WBC 4.2 4.0  HGB 5.5* 8.4*  HCT 20.5* 28.6*  PLT 384 472*    Recent Labs Lab 01/18/14 1446 01/19/14 0535  NA 139 139  K 3.6* 4.4  CL 104 104  CO2 23 20  GLUCOSE 83 90  BUN 9 9  CREATININE 0.64 0.64  CALCIUM 9.0 9.1   Transvaginal US  IMPRESSION:  1. Fibroid uterus. When compared with exam from 01/13/2010 there has  been an overall increase in volume of the uterus. This likely  reflects enlarging uterine fibroids.  2. Bilateral ovarian cysts.   Results/Tests Pending at Time of Discharge: none  Discharge Medications:    Medication List         ferrous sulfate 325 (65 FE) MG tablet  Take 1 tablet (325 mg total) by mouth daily with breakfast.     folic acid 1 MG tablet  Commonly known as:  FOLVITE  Take 1 tablet (1 mg total) by mouth daily.     naproxen sodium 220 MG tablet  Commonly known as:  ANAPROX  Take 440 mg by mouth 3 (three) times daily as needed.     VITAMIN B 12 PO  Take 2 tablets by mouth daily.        Discharge Instructions: Please refer to Patient Instructions  section of EMR for full details.  Patient was counseled important signs and symptoms that should prompt return to medical care, changes in medications, dietary instructions, activity restrictions, and follow up appointments.   Follow-Up Appointments: Follow-up Information   Follow up with Wendie Agreste, MD In 1 week.   Specialties:  Family Medicine, Sports Medicine   Contact information:   [place] [address] [phone]       Rosemarie Ax, MD 01/20/2014, 9:32 PM PGY-1, [place]

## 2014-01-19 NOTE — Discharge Instructions (Signed)
You were found to be anemic due to your heavy cycle and fibroids. Please take the iron pills with an acidic food for better absorption. Please follow up with a gynecologist for a plan for your fibroids going forward.   Iron Deficiency Anemia, Adult Anemia is a condition in which there are less red blood cells or hemoglobin in the blood than normal. Hemoglobin is this part of red blood cells that carries oxygen. Iron deficiency anemia is anemia caused by too little iron. It is the most common type of anemia. It may leave you tired and short of breath. CAUSES   Lack of iron in the diet.  Poor absorption of iron, as seen with intestinal disorders.  Intestinal bleeding.  Heavy periods. SIGNS AND SYMPTOMS  Mild anemia may not be noticeable. Symptoms may include:  Fatigue.  Headache.  Pale skin.  Weakness.  Tiredness.  Shortness of breath.  Dizziness.  Cold hands and feet.  Fast or irregular heartbeat. DIAGNOSIS  Diagnosis requires a thorough evaluation and physical exam by your health care provider. Blood tests are generally used to confirm iron deficiency anemia. Additional tests may be done to find the underlying cause of your anemia. These may include:  Testing for blood in the stool (fecal occult blood test).  A procedure to see inside the colon and rectum (colonoscopy).  A procedure to see inside the esophagus and stomach (endoscopy). TREATMENT  Iron deficiency anemia is treated by correcting the cause of the deficiency. Treatment may involve:  Adding iron-rich foods to your diet.  Taking iron supplements. Pregnant or breastfeeding women need to take extra iron, because their normal diet usually does not provide the required amount.  Taking vitamins. Vitamin C improves the absorption of iron. Your health care provider may recommend taking your iron tablets with a glass of orange juice or vitamin C supplement.  Medicines to make heavy menstrual flow  lighter.  Surgery. HOME CARE INSTRUCTIONS   Take iron as directed by your health care provider.  If you cannot tolerate taking iron supplements by mouth, talk to your health care provider about taking them through a vein (intravenously) or an injection into a muscle.  For the best iron absorption, iron supplements should be taken on an empty stomach. If you cannot tolerate them on an empty stomach, you may need to take them with food.  Do not drink milk or take antacids at the same time as your iron supplements. Milk and antacids may interfere with the absorption of iron.  Iron supplements can cause constipation. Make sure to include fiber in your diet to prevent constipation. A stool softener may also be recommended.  Take vitamins as directed by your health care provider.  Eat a diet rich in iron. Foods high in iron include liver, lean beef, whole-grain bread, eggs, dried fruit, and dark green, leafy vegetables. SEEK IMMEDIATE MEDICAL CARE IF:   You faint. If this happens, do not drive. Call your local emergency services (911 in [place] if no other help is available.  You have chest pain.  You feel nauseous or vomit.  You have severe or increased shortness of breath with activity.  You feel weak.  You have a rapid heartbeat.  You have unexplained sweating.  You become lightheaded when getting up from a chair or bed. MAKE SURE YOU:   Understand these instructions.  Will watch your condition.  Will get help right away if you are not doing well or get worse. Document Released: 08/25/2000 Document  Revised: 06/18/2013 Document Reviewed: 05/05/2013 [facility] Patient Information 2014 [place], [place].

## 2014-01-20 LAB — TYPE AND SCREEN
ABO/RH(D): O POS
Antibody Screen: NEGATIVE
Unit division: 0
Unit division: 0

## 2014-01-23 NOTE — Discharge Summary (Signed)
Family Medicine Teaching Service  Discharge Note : Attending Jeff Walden MD Pager [phone] Inpatient Team Pager:  [phone]  I have reviewed this patient and the patient's chart and have discussed discharge planning with the resident at the time of discharge. I agree with the discharge plan as above.    

## 2014-01-28 ENCOUNTER — Ambulatory Visit: Payer: BC Managed Care – PPO | Admitting: Family Medicine

## 2014-01-28 VITALS — BP 116/70 | HR 81 | Temp 98.5°F | Resp 18 | Ht 63.5 in | Wt 179.0 lb

## 2014-01-28 DIAGNOSIS — N83202 Unspecified ovarian cyst, left side: Secondary | ICD-10-CM

## 2014-01-28 DIAGNOSIS — D649 Anemia, unspecified: Secondary | ICD-10-CM

## 2014-01-28 DIAGNOSIS — D259 Leiomyoma of uterus, unspecified: Secondary | ICD-10-CM

## 2014-01-28 DIAGNOSIS — N83209 Unspecified ovarian cyst, unspecified side: Secondary | ICD-10-CM

## 2014-01-28 DIAGNOSIS — N83201 Unspecified ovarian cyst, right side: Secondary | ICD-10-CM

## 2014-01-28 LAB — POCT CBC
Granulocyte percent: 59.3 % (ref 37–80)
HCT, POC: 29.3 % — AB (ref 37.7–47.9)
Hemoglobin: 8.8 g/dL — AB (ref 12.2–16.2)
Lymph, poc: 2.2 (ref 0.6–3.4)
MCH, POC: 18.8 pg — AB (ref 27–31.2)
MCHC: 30 g/dL — AB (ref 31.8–35.4)
MCV: 62.5 fL — AB (ref 80–97)
MID (cbc): 0.6 (ref 0–0.9)
POC Granulocyte: 4.1 (ref 2–6.9)
POC LYMPH PERCENT: 31.4 % (ref 10–50)
POC MID %: 9.3 % (ref 0–12)
Platelet Count, POC: 407 K/uL (ref 142–424)
RBC: 4.68 M/uL (ref 4.04–5.48)
RDW, POC: 23.2 %
WBC: 6.9 K/uL (ref 4.6–10.2)

## 2014-01-28 MED ORDER — FERROUS SULFATE 325 (65 FE) MG PO TABS: 325.0000 mg | ORAL_TABLET | Freq: Two times a day (BID) | ORAL | Status: DC

## 2014-01-28 NOTE — Patient Instructions (Addendum)
Your blood count was stable and slightly improved. We will refer you to OB/GYN to discuss fibroids and treatment options, as well as the ovarian cysts. . In the mean time increase the iron to 2 x a day and depending on appointment with OB/GYN,  recommend recheck blood count in the next 2 weeks at OB/GYN or here. Return to the clinic or go to the nearest emergency room if any of your symptoms worsen or new symptoms occur.  Uterine Fibroid A uterine fibroid is a growth (tumor) that occurs in your uterus. This type of tumor is not cancerous and does not spread out of the uterus. You can have one or many fibroids. Fibroids can vary in size, weight, and where they grow in the uterus. Some can become quite large. Most fibroids do not require medical treatment, but some can cause pain or heavy bleeding during and between periods. CAUSES  A fibroid is the result of a single uterine cell that keeps growing (unregulated), which is different than most cells in the human body. Most cells have a control mechanism that keeps them from reproducing without control.  SIGNS AND SYMPTOMS   Bleeding.  Pelvic pain and pressure.  Bladder problems due to the size of the fibroid.  Infertility and miscarriages depending on the size and location of the fibroid. DIAGNOSIS  Uterine fibroids are diagnosed through a physical exam. Your health care provider may feel the lumpy tumors during a pelvic exam. Ultrasonography may be done to get information regarding size, location, and number of tumors.  TREATMENT   Your health care provider may recommend watchful waiting. This involves getting the fibroid checked by your health care provider to see if it grows or shrinks.   Hormone treatment or an intrauterine device (IUD) may be prescribed.   Surgery may be needed to remove the fibroids (myomectomy) or the uterus (hysterectomy). This depends on your situation. When fibroids interfere with fertility and a woman wants to  become pregnant, a health care provider may recommend having the fibroids removed.  [place] care depends on how you were treated. In general:   Keep all follow-up appointments with your health care provider.   Only take over-the-counter or prescription medicines as directed by your health care provider. If you were prescribed a hormone treatment, take the hormone medicines exactly as directed. Do not take aspirin. It can cause bleeding.   Talk to your health care provider about taking iron pills.  If your periods are troublesome but not so heavy, lie down with your feet raised slightly above your heart. Place cold packs on your lower abdomen.   If your periods are heavy, write down the number of pads or tampons you use per month. Bring this information to your health care provider.   Include green vegetables in your diet.  SEEK IMMEDIATE MEDICAL CARE IF:  You have pelvic pain or cramps not controlled with medicines.   You have a sudden increase in pelvic pain.   You have an increase in bleeding between and during periods.   You have excessive periods and soak tampons or pads in a half hour or less.  You feel lightheaded or have fainting episodes. Document Released: 08/25/2000 Document Revised: 06/18/2013 Document Reviewed: 03/27/2013 [facility] Patient Information 2014 [place], [place].    Anemia, Nonspecific Anemia is a condition in which the concentration of red blood cells or hemoglobin in the blood is below normal. Hemoglobin is a substance in red blood cells that  carries oxygen to the tissues of the body. Anemia results in not enough oxygen reaching these tissues.  CAUSES  Common causes of anemia include:   Excessive bleeding. Bleeding may be internal or external. This includes excessive bleeding from periods (in women) or from the intestine.   Poor nutrition.   Chronic kidney, thyroid, and liver disease.  Bone marrow disorders that decrease  red blood cell production.  Cancer and treatments for cancer.  HIV, AIDS, and their treatments.  Spleen problems that increase red blood cell destruction.  Blood disorders.  Excess destruction of red blood cells due to infection, medicines, and autoimmune disorders. SIGNS AND SYMPTOMS   Minor weakness.   Dizziness.   Headache.  Palpitations.   Shortness of breath, especially with exercise.   Paleness.  Cold sensitivity.  Indigestion.  Nausea.  Difficulty sleeping.  Difficulty concentrating. Symptoms may occur suddenly or they may develop slowly.  DIAGNOSIS  Additional blood tests are often needed. These help your health care provider determine the best treatment. Your health care provider will check your stool for blood and look for other causes of blood loss.  TREATMENT  Treatment varies depending on the cause of the anemia. Treatment can include:   Supplements of iron, vitamin I69, or folic acid.   Hormone medicines.   A blood transfusion. This may be needed if blood loss is severe.   Hospitalization. This may be needed if there is significant continual blood loss.   Dietary changes.  Spleen removal. HOME CARE INSTRUCTIONS Keep all follow-up appointments. It often takes many weeks to correct anemia, and having your health care provider check on your condition and your response to treatment is very important. SEEK IMMEDIATE MEDICAL CARE IF:   You develop extreme weakness, shortness of breath, or chest pain.   You become dizzy or have trouble concentrating.  You develop heavy vaginal bleeding.   You develop a rash.   You have bloody or black, tarry stools.   You faint.   You vomit up blood.   You vomit repeatedly.   You have abdominal pain.  You have a fever or persistent symptoms for more than 2 3 days.   You have a fever and your symptoms suddenly get worse.   You are dehydrated.  MAKE SURE YOU:  Understand these  instructions.  Will watch your condition.  Will get help right away if you are not doing well or get worse. Document Released: 10/05/2004 Document Revised: 04/30/2013 Document Reviewed: 02/21/2013 [facility] Patient Information 2014 [place].

## 2014-01-28 NOTE — Progress Notes (Addendum)
Subjective:    Patient ID: Katie [name], female    DOB: 06/17/1971, 43 y.o.   MRN: [id number] This chart was scribed for Merri Ray, MD by Anastasia Pall, ED Scribe. This patient was seen in room 04 and the patient's care was started at 5:56 PM.  Chief Complaint  Patient presents with  . Follow-up    hospital visit 01/18/2014   HPI Katie [name] is a 43 y.o. female Pt seen 10 days ago. At that time had left sided chest pain and left arm pain as well as numbness and tingling in her feet. Known to have occasional neck pain at that time and some DOE. Noted some increased stress then with working two jobs. She was noted to be anemic with hemoglobin 6.3. Sent to ER and then admitted 05/10-05/11. H/o fibroids thought to be cause of anemia. IN ER hemoglobin was 5.5. Transfused 2 units with a subsequent hemoglobin of 8.4. She had transvaginal US showing enlarging fibroids since 2011. Started on daily iron and to follow up with GYN. Of note her Troponin levels were negative and EKG sinus rhythm and chest pain improved during hospitalization. Most recent CBC was hemoglobin of 8.4 as above.    Pt is here for 1 week f/u. She has been on iron once a day. She reports feeling improved, but could be better, still feels tired. She denies any vaginal bleeding, tarry or bloody stools. She reports her stool is darkened by the iron. She reports some abdominal pain she states is most likely from fibroids. She reports some constipation with taking iron, takes stool softener PRN. She reports taking some Naproxen PRN. She denies h/o stomach ulcers. She states she was referred to GYN she saw years ago, but has not gone recently preferring to be referred to another GYN. She states 1 month ago being seen by former GYN MD who suggested robotic surgery.    transvaginal/pelvic US report from 01/18/14 noted: Measurements: 17.8 x 9.3 x 15.1 cm. The volume of the uterus is  approximately 1250 cc. Previously approximately  740 cc. Multiple  fibroids: There is a subserosal fibroid arising from the anterior  uterine fundus measuring 5.6 x 5.0 x 4.2 cm. Intramural fibroid  arising from the posterior myometrium measures 9.1 x 6.9 x 8.9 cm.  Adjacent posterior fundal fibroid measures 8.3 x 7.0 x 8.0 cm. Small  anterior intramural fibroid measures 4 x 3.5 x 3.6 cm.  Few bilateral ovarian cysts also seen.    She denies dysphoric mood, just reports being tired. She denies enjoying her work, states she goes home and watches TV after work.  No anhedonia. 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 She denies chest pain, SOB.   PCP - No PCP Per Patient  Patient Active Problem List   Diagnosis Date Noted  . Anemia 01/18/2014  . Chest pain 01/18/2014  . Hypokalemia 01/18/2014   Past Medical History  Diagnosis Date  . Herpes    Past Surgical History  Procedure Laterality Date  . Tubal ligation     No Known Allergies Prior to Admission medications   Medication Sig Start Date End Date Taking? Authorizing Provider  Cyanocobalamin (VITAMIN B 12 PO) Take 2 tablets by mouth daily.   Yes Historical Provider, MD  ferrous sulfate 325 (65 FE) MG tablet Take 1 tablet (325 mg total) by mouth daily with breakfast. 01/20/14  Yes Enid Baas  Raeford Razor, MD  folic acid (FOLVITE) 1 MG tablet Take 1 tablet (1 mg total) by mouth daily. 01/19/14  Yes Rosemarie Ax, MD  naproxen sodium (ANAPROX) 220 MG tablet Take 440 mg by mouth 3 (three) times daily as needed.   Yes Historical Provider, MD   Review of Systems  Constitutional: Positive for fatigue.  Respiratory: Negative for shortness of breath.   Cardiovascular: Negative for chest pain and leg swelling.  Gastrointestinal: Positive for abdominal pain and constipation. Negative for blood in stool.  Genitourinary: Negative for vaginal bleeding.  Psychiatric/Behavioral: Negative for dysphoric mood.        Objective:   Physical Exam  Vitals reviewed. Constitutional: She is oriented to person, place, and time. She appears well-developed and well-nourished.  HENT:  Head: Normocephalic and atraumatic.  Mouth/Throat: Uvula is midline and oropharynx is clear and moist. Mucous membranes are pale.  Eyes: Conjunctivae and EOM are normal. Pupils are equal, round, and reactive to light.  Neck: Carotid bruit is not present.  Cardiovascular: Normal rate, regular rhythm, normal heart sounds and intact distal pulses.   No murmur heard. Pulmonary/Chest: Effort normal and breath sounds normal. No respiratory distress. She has no wheezes. She has no rales.  Abdominal: Soft. Bowel sounds are normal. She exhibits mass (lower  abdomen firm masses - nt. ). She exhibits no pulsatile midline mass. There is no tenderness.  Musculoskeletal: Normal range of motion. She exhibits no edema.  Neurological: She is alert and oriented to person, place, and time.  Skin: Skin is warm and dry.  Psychiatric: She has a normal mood and affect. Her behavior is normal.  BP 116/70  Pulse 81  Temp(Src) 98.5 F (36.9 C)  Resp 18  Ht 5' 3.5" (1.613 m)  Wt 179 lb (81.194 kg)  BMI 31.21 kg/m2  SpO2 98%  LMP 12/22/2013  Results for orders placed in visit on 01/28/14  POCT CBC      Result Value Ref Range   WBC 6.9  4.6 - 10.2 K/uL   Lymph, poc 2.2  0.6 - 3.4   POC LYMPH PERCENT 31.4  10 - 50 %L   MID (cbc) 0.6  0 - 0.9   POC MID % 9.3  0 - 12 %M   POC Granulocyte 4.1  2 - 6.9   Granulocyte percent 59.3  37 - 80 %G   RBC 4.68  4.04 - 5.48 M/uL   Hemoglobin 8.8 (*) 12.2 - 16.2 g/dL   HCT, POC 29.3 (*) 37.7 - 47.9 %   MCV 62.5 (*) 80 - 97 fL   MCH, POC 18.8 (*) 27 - 31.2 pg   MCHC 30.0 (*) 31.8 - 35.4 g/dL   RDW, POC 23.2     Platelet Count, POC 407  142 - 424 K/uL   MPV    0 - 99.8 fL      Assessment & Plan:   Katie [name] is a 43 y.o. female Anemia - Plan: POCT CBC, Ambulatory referral to Gynecology, ferrous  sulfate 325 (65 FE) MG tablet  - stable, minimal improvement. Increase iron to BID, and recehck levels in 2 weeks here or at GYN. rtc precautions if symptomatic.   Uterine fibroids  - Plan: Ambulatory referral to Gynecology as enlarging.  To discuss options, but discussed concerns with the degree of anemia, and need for hospitalization and transfusion, that needs to discuss options with OBGYN.  Referred to different office at her request for 2nd opinion. Prior recommendation of  robotic assisted hysterectomy.   Bilateral ovarian cysts  - follow up with GYN.    Meds ordered this encounter  Medications  . ferrous sulfate 325 (65 FE) MG tablet    Sig: Take 1 tablet (325 mg total) by mouth 2 (two) times daily with a meal.    Dispense:  60 tablet    Refill:  1   Patient Instructions  Your blood count was stable and slightly improved. We will refer you to OB/GYN to discuss fibroids and treatment options, as well as the ovarian cysts. . In the mean time increase the iron to 2 x a day and depending on appointment with OB/GYN,  recommend recheck blood count in the next 2 weeks at OB/GYN or here. Return to the clinic or go to the nearest emergency room if any of your symptoms worsen or new symptoms occur.  Uterine Fibroid A uterine fibroid is a growth (tumor) that occurs in your uterus. This type of tumor is not cancerous and does not spread out of the uterus. You can have one or many fibroids. Fibroids can vary in size, weight, and where they grow in the uterus. Some can become quite large. Most fibroids do not require medical treatment, but some can cause pain or heavy bleeding during and between periods. CAUSES  A fibroid is the result of a single uterine cell that keeps growing (unregulated), which is different than most cells in the human body. Most cells have a control mechanism that keeps them from reproducing without control.  SIGNS AND SYMPTOMS   Bleeding.  Pelvic pain and  pressure.  Bladder problems due to the size of the fibroid.  Infertility and miscarriages depending on the size and location of the fibroid. DIAGNOSIS  Uterine fibroids are diagnosed through a physical exam. Your health care provider may feel the lumpy tumors during a pelvic exam. Ultrasonography may be done to get information regarding size, location, and number of tumors.  TREATMENT   Your health care provider may recommend watchful waiting. This involves getting the fibroid checked by your health care provider to see if it grows or shrinks.   Hormone treatment or an intrauterine device (IUD) may be prescribed.   Surgery may be needed to remove the fibroids (myomectomy) or the uterus (hysterectomy). This depends on your situation. When fibroids interfere with fertility and a woman wants to become pregnant, a health care provider may recommend having the fibroids removed.  [place] care depends on how you were treated. In general:   Keep all follow-up appointments with your health care provider.   Only take over-the-counter or prescription medicines as directed by your health care provider. If you were prescribed a hormone treatment, take the hormone medicines exactly as directed. Do not take aspirin. It can cause bleeding.   Talk to your health care provider about taking iron pills.  If your periods are troublesome but not so heavy, lie down with your feet raised slightly above your heart. Place cold packs on your lower abdomen.   If your periods are heavy, write down the number of pads or tampons you use per month. Bring this information to your health care provider.   Include green vegetables in your diet.  SEEK IMMEDIATE MEDICAL CARE IF:  You have pelvic pain or cramps not controlled with medicines.   You have a sudden increase in pelvic pain.   You have an increase in bleeding between and during periods.   You have excessive  periods and soak  tampons or pads in a half hour or less.  You feel lightheaded or have fainting episodes. Document Released: 08/25/2000 Document Revised: 06/18/2013 Document Reviewed: 03/27/2013 [facility] Patient Information 2014 [place], [place].    Anemia, Nonspecific Anemia is a condition in which the concentration of red blood cells or hemoglobin in the blood is below normal. Hemoglobin is a substance in red blood cells that carries oxygen to the tissues of the body. Anemia results in not enough oxygen reaching these tissues.  CAUSES  Common causes of anemia include:   Excessive bleeding. Bleeding may be internal or external. This includes excessive bleeding from periods (in women) or from the intestine.   Poor nutrition.   Chronic kidney, thyroid, and liver disease.  Bone marrow disorders that decrease red blood cell production.  Cancer and treatments for cancer.  HIV, AIDS, and their treatments.  Spleen problems that increase red blood cell destruction.  Blood disorders.  Excess destruction of red blood cells due to infection, medicines, and autoimmune disorders. SIGNS AND SYMPTOMS   Minor weakness.   Dizziness.   Headache.  Palpitations.   Shortness of breath, especially with exercise.   Paleness.  Cold sensitivity.  Indigestion.  Nausea.  Difficulty sleeping.  Difficulty concentrating. Symptoms may occur suddenly or they may develop slowly.  DIAGNOSIS  Additional blood tests are often needed. These help your health care provider determine the best treatment. Your health care provider will check your stool for blood and look for other causes of blood loss.  TREATMENT  Treatment varies depending on the cause of the anemia. Treatment can include:   Supplements of iron, vitamin Z61, or folic acid.   Hormone medicines.   A blood transfusion. This may be needed if blood loss is severe.   Hospitalization. This may be needed if there is significant continual  blood loss.   Dietary changes.  Spleen removal. HOME CARE INSTRUCTIONS Keep all follow-up appointments. It often takes many weeks to correct anemia, and having your health care provider check on your condition and your response to treatment is very important. SEEK IMMEDIATE MEDICAL CARE IF:   You develop extreme weakness, shortness of breath, or chest pain.   You become dizzy or have trouble concentrating.  You develop heavy vaginal bleeding.   You develop a rash.   You have bloody or black, tarry stools.   You faint.   You vomit up blood.   You vomit repeatedly.   You have abdominal pain.  You have a fever or persistent symptoms for more than 2 3 days.   You have a fever and your symptoms suddenly get worse.   You are dehydrated.  MAKE SURE YOU:  Understand these instructions.  Will watch your condition.  Will get help right away if you are not doing well or get worse. Document Released: 10/05/2004 Document Revised: 04/30/2013 Document Reviewed: 02/21/2013 [facility] Patient Information 2014 [place].        I personally performed the services described in this documentation, which was scribed in my presence. The recorded information has been reviewed and considered, and addended by me as needed.

## 2014-03-27 ENCOUNTER — Encounter (HOSPITAL_COMMUNITY): Payer: Self-pay | Admitting: Pharmacist

## 2014-04-02 ENCOUNTER — Encounter (HOSPITAL_COMMUNITY)
Admission: RE | Admit: 2014-04-02 | Discharge: 2014-04-02 | Disposition: A | Discharge status: Home / Self Care | Payer: BC Managed Care – PPO | Source: Ambulatory Visit | Attending: Obstetrics and Gynecology | Admitting: Obstetrics and Gynecology

## 2014-04-02 ENCOUNTER — Encounter (HOSPITAL_COMMUNITY): Payer: Self-pay

## 2014-04-02 DIAGNOSIS — Z01818 Encounter for other preprocedural examination: Secondary | ICD-10-CM | POA: Insufficient documentation

## 2014-04-02 DIAGNOSIS — Z01812 Encounter for preprocedural laboratory examination: Secondary | ICD-10-CM | POA: Insufficient documentation

## 2014-04-02 HISTORY — DX: Other specified health status: Z78.9

## 2014-04-02 LAB — CBC
HCT: 30 % — ABNORMAL LOW (ref 36.0–46.0)
Hemoglobin: 9.9 g/dL — ABNORMAL LOW (ref 12.0–15.0)
MCH: 27.7 pg (ref 26.0–34.0)
MCHC: 33 g/dL (ref 30.0–36.0)
MCV: 83.8 fL (ref 78.0–100.0)
Platelets: 327 10*3/uL (ref 150–400)
RBC: 3.58 MIL/uL — ABNORMAL LOW (ref 3.87–5.11)
WBC: 5.5 10*3/uL (ref 4.0–10.5)

## 2014-04-02 LAB — TYPE AND SCREEN
ABO/RH(D): O POS
Antibody Screen: NEGATIVE

## 2014-04-02 LAB — ABO/RH: ABO/RH(D): O POS

## 2014-04-02 NOTE — H&P (Addendum)
  Patient name  Katie, [name] DICTATION#  [id number] CSN# [id number]  [facility], MD 04/02/2014 10:38 AM

## 2014-04-02 NOTE — Patient Instructions (Signed)
[place]  04/02/2014   Your procedure is scheduled on:  04/07/14  Enter through the Main Entrance of [facility] at [place] up the phone at the desk and dial [phone].   Call this number if you have problems the morning of surgery: [phone]   Remember:   Do not eat food:After Midnight.  Do not drink clear liquids: After Midnight.  Take these medicines the morning of surgery with A SIP OF WATER: NA   Do not wear jewelry, make-up or nail polish.  Do not wear lotions, powders, or perfumes. You may wear deodorant.  Do not shave 48 hours prior to surgery.  Do not bring valuables to the hospital.  [facility] is not   responsible for any belongings or valuables brought to the hospital.  Contacts, dentures or bridgework may not be worn into surgery.  Leave suitcase in the car. After surgery it may be brought to your room.  For patients admitted to the hospital, checkout time is 11:00 AM the day of              discharge.   Patients discharged the day of surgery will not be allowed to drive             home.  Name and phone number of your driver: NA  Special Instructions:      Please read over the following fact sheets that you were given:   Surgical Site Infection Prevention

## 2014-04-02 NOTE — H&P (Signed)
NAME:  Katie [name], Katie [name] NO.:  [id number]  MEDICAL RECORD NO.:  [id number]  LOCATION:  [facility]                         FACILITY:  [facility]  PHYSICIAN:  Darlyn Chamber, M.D.   DATE OF BIRTH:  1971/06/27  DATE OF ADMISSION:  04/07/2014 DATE OF DISCHARGE:                             HISTORY & PHYSICAL   HISTORY OF PRESENT ILLNESS:  The patient is a 43 year old, gravida 68, para 2 female who was referred to our practice by Dr. Nyoka Cowden for evaluation of uterine fibroids with associated heavy menstrual cycles. She was having 8-10 days of flow, 3 days being heavy, changing pads every 1-2 hours.  Associated this was cramping.  She had a significant anemia, was hospitalized from Jan 18, 2014 to Jan 20, 2014, and given blood transfusions.  Her hemoglobin at discharge was 8.8.  She was subsequently placed on iron sulfate supplementation and birth control pills to increase her blood count.  She had an ultrasound performed that revealed multiple uterine fibroids.  On evaluation in the office, she had large fibroids up above the umbilicus.  We went over alternatives, options could include Depo-Provera birth control pills, ablation technique or hysterectomy.  The patient has been favored the latter, will proceed with total abdominal hysterectomy.  The ovaries appeared to be normal.  They are going to be left in place.  ALLERGIES:  In terms of allergies, she has no known drug allergies.  MEDICATIONS:  Iron sulfate supplementation.  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  PAST SURGICAL HISTORY:  She has had a previous bilateral tubal ligation.  SOCIAL HISTORY:  Reveals no tobacco and only occasional alcohol use.  FAMILY HISTORY:  Noncontributory.  REVIEW OF SYSTEMS:  Noncontributory.  PHYSICAL EXAMINATION:  VITAL SIGNS:  The patient is afebrile with stable vital signs. HEENT:  The patient is normocephalic.  Pupils are equal, round,  reactive to light and accommodation.  Extraocular movements are intact.  Sclerae and conjunctivae are clear.  Oropharynx clear. NECK:  Without thyromegaly. BREASTS:  No discrete masses. ABDOMEN:  She has uterus up above the umbilicus.  No other masses are noted. PELVIC:  Normal external genitalia.  Vaginal mucosa is clear.  Cervix unremarkable.  Uterus upper limits of normal size.  Adnexa unremarkable. EXTREMITIES:  Trace edema. NEUROLOGIC:  Grossly within normal limits.  IMPRESSION:  Uterine fibroids and associated menorrhagia and anemia.  PLAN:  The patient undergo a total abdominal hysterectomy.  The nature of the procedure have been discussed, alternatives have been explained, the risk including the risk of infection.  Risk of hemorrhage that could require transfusion with the risk of AIDS or hepatitis.  Risk of injury to adjacent organs including bladder, bowel, or ureters that could require further exploratory surgery.  Risk of deep venous thrombosis and pulmonary embolus.  The patient expressed understanding of the risks, complications and potential alternative.     Darlyn Chamber, M.D.     JSM/MEDQ  D:  04/02/2014  T:  04/02/2014  Job:  [id number]

## 2014-04-06 ENCOUNTER — Encounter (HOSPITAL_COMMUNITY): Payer: Self-pay | Admitting: Anesthesiology

## 2014-04-06 NOTE — Anesthesia Preprocedure Evaluation (Signed)
Anesthesia Evaluation  Patient identified by MRN, date of birth, ID band Patient awake    Reviewed: Allergy & Precautions, H&P , NPO status , Patient's Chart, lab work & pertinent test results  Airway Mallampati: II TM Distance: >3 FB Neck ROM: Full    Dental no notable dental hx. (+) Teeth Intact   Pulmonary neg pulmonary ROS,  breath sounds clear to auscultation  Pulmonary exam normal       Cardiovascular negative cardio ROS  Rhythm:Regular Rate:Normal     Neuro/Psych negative neurological ROS  negative psych ROS   GI/Hepatic Neg liver ROS,   Endo/Other  negative endocrine ROSObesity  Renal/GU negative Renal ROS  negative genitourinary   Musculoskeletal negative musculoskeletal ROS (+)   Abdominal (+) + obese,   Peds  Hematology  (+) anemia ,   Anesthesia Other Findings   Reproductive/Obstetrics Fibroid uterus-symptomatic                           Anesthesia Physical Anesthesia Plan  ASA: II  Anesthesia Plan: General   Post-op Pain Management:    Induction:   Airway Management Planned: Oral ETT  Additional Equipment:   Intra-op Plan:   Post-operative Plan: Extubation in OR  Informed Consent: I have reviewed the patients History and Physical, chart, labs and discussed the procedure including the risks, benefits and alternatives for the proposed anesthesia with the patient or authorized representative who has indicated his/her understanding and acceptance.   Dental advisory given  Plan Discussed with: CRNA, Anesthesiologist and Surgeon  Anesthesia Plan Comments:         Anesthesia Quick Evaluation

## 2014-04-07 ENCOUNTER — Encounter (HOSPITAL_COMMUNITY)
Admission: RE | Disposition: A | Discharge status: Home / Self Care | Payer: Self-pay | Source: Ambulatory Visit | Attending: Obstetrics and Gynecology

## 2014-04-07 ENCOUNTER — Inpatient Hospital Stay (HOSPITAL_COMMUNITY)
Admission: RE | Admit: 2014-04-07 | Discharge: 2014-04-09 | DRG: 743 | Disposition: A | Discharge status: Home / Self Care | Payer: BC Managed Care – PPO | Source: Ambulatory Visit | Attending: Obstetrics and Gynecology | Admitting: Obstetrics and Gynecology

## 2014-04-07 ENCOUNTER — Encounter (HOSPITAL_COMMUNITY): Payer: BC Managed Care – PPO | Admitting: Anesthesiology

## 2014-04-07 ENCOUNTER — Inpatient Hospital Stay (HOSPITAL_COMMUNITY): Payer: BC Managed Care – PPO | Admitting: Anesthesiology

## 2014-04-07 ENCOUNTER — Encounter (HOSPITAL_COMMUNITY): Payer: Self-pay | Admitting: *Deleted

## 2014-04-07 DIAGNOSIS — N920 Excessive and frequent menstruation with regular cycle: Secondary | ICD-10-CM | POA: Diagnosis present

## 2014-04-07 DIAGNOSIS — Z90710 Acquired absence of both cervix and uterus: Secondary | ICD-10-CM

## 2014-04-07 DIAGNOSIS — D251 Intramural leiomyoma of uterus: Secondary | ICD-10-CM | POA: Diagnosis present

## 2014-04-07 DIAGNOSIS — D252 Subserosal leiomyoma of uterus: Principal | ICD-10-CM | POA: Diagnosis present

## 2014-04-07 DIAGNOSIS — N72 Inflammatory disease of cervix uteri: Secondary | ICD-10-CM | POA: Diagnosis present

## 2014-04-07 DIAGNOSIS — D649 Anemia, unspecified: Secondary | ICD-10-CM | POA: Diagnosis present

## 2014-04-07 DIAGNOSIS — D279 Benign neoplasm of unspecified ovary: Secondary | ICD-10-CM | POA: Diagnosis present

## 2014-04-07 DIAGNOSIS — D250 Submucous leiomyoma of uterus: Secondary | ICD-10-CM | POA: Diagnosis present

## 2014-04-07 HISTORY — PX: ABDOMINAL HYSTERECTOMY: SHX81

## 2014-04-07 LAB — CBC
HCT: 32 % — ABNORMAL LOW (ref 36.0–46.0)
HCT: 23.8 % — ABNORMAL LOW (ref 36.0–46.0)
HCT: 22.8 % — ABNORMAL LOW (ref 36.0–46.0)
Hemoglobin: 10.3 g/dL — ABNORMAL LOW (ref 12.0–15.0)
Hemoglobin: 7.7 g/dL — ABNORMAL LOW (ref 12.0–15.0)
Hemoglobin: 7.2 g/dL — ABNORMAL LOW (ref 12.0–15.0)
MCH: 26.9 pg (ref 26.0–34.0)
MCH: 27.1 pg (ref 26.0–34.0)
MCH: 26.5 pg (ref 26.0–34.0)
MCHC: 32.2 g/dL (ref 30.0–36.0)
MCHC: 32.4 g/dL (ref 30.0–36.0)
MCHC: 31.6 g/dL (ref 30.0–36.0)
MCV: 83.6 fL (ref 78.0–100.0)
MCV: 83.8 fL (ref 78.0–100.0)
MCV: 83.8 fL (ref 78.0–100.0)
Platelets: 426 10*3/uL — ABNORMAL HIGH (ref 150–400)
Platelets: 469 10*3/uL — ABNORMAL HIGH (ref 150–400)
Platelets: 443 10*3/uL — ABNORMAL HIGH (ref 150–400)
RBC: 3.83 MIL/uL — ABNORMAL LOW (ref 3.87–5.11)
RBC: 2.84 MIL/uL — ABNORMAL LOW (ref 3.87–5.11)
RBC: 2.72 MIL/uL — ABNORMAL LOW (ref 3.87–5.11)
RDW: 18.7 % — ABNORMAL HIGH (ref 11.5–15.5)
WBC: 5.4 10*3/uL (ref 4.0–10.5)
WBC: 20.6 10*3/uL — ABNORMAL HIGH (ref 4.0–10.5)
WBC: 17.8 10*3/uL — ABNORMAL HIGH (ref 4.0–10.5)

## 2014-04-07 LAB — HCG, SERUM, QUALITATIVE: Preg, Serum: NEGATIVE

## 2014-04-07 LAB — PREPARE RBC (CROSSMATCH)

## 2014-04-07 SURGERY — HYSTERECTOMY, ABDOMINAL
Anesthesia: General | Site: Abdomen

## 2014-04-07 MED ORDER — HYDROMORPHONE HCL PF 1 MG/ML IJ SOLN
0.2500 mg | INTRAMUSCULAR | Status: DC | PRN
  Administered 2014-04-07 (×4): 0.5 mg via INTRAVENOUS

## 2014-04-07 MED ORDER — KETOROLAC TROMETHAMINE 30 MG/ML IJ SOLN
INTRAMUSCULAR | Status: DC | PRN
  Administered 2014-04-07: 30 mg via INTRAVENOUS

## 2014-04-07 MED ORDER — SCOPOLAMINE 1 MG/3DAYS TD PT72
1.0000 | MEDICATED_PATCH | TRANSDERMAL | Status: DC
  Administered 2014-04-07: 1.5 mg via TRANSDERMAL

## 2014-04-07 MED ORDER — ONDANSETRON HCL 4 MG/2ML IJ SOLN: 4.0000 mg | Freq: Four times a day (QID) | INTRAMUSCULAR | Status: DC | PRN

## 2014-04-07 MED ORDER — ROCURONIUM BROMIDE 100 MG/10ML IV SOLN
INTRAVENOUS | Status: DC | PRN
  Administered 2014-04-07: 10 mg via INTRAVENOUS
  Administered 2014-04-07: 40 mg via INTRAVENOUS
  Administered 2014-04-07: 10 mg via INTRAVENOUS

## 2014-04-07 MED ORDER — GLYCOPYRROLATE 0.2 MG/ML IJ SOLN
INTRAMUSCULAR | Status: DC | PRN
  Administered 2014-04-07: 0.6 mg via INTRAVENOUS

## 2014-04-07 MED ORDER — ONDANSETRON HCL 4 MG PO TABS: 4.0000 mg | ORAL_TABLET | Freq: Four times a day (QID) | ORAL | Status: DC | PRN

## 2014-04-07 MED ORDER — DIPHENHYDRAMINE HCL 12.5 MG/5ML PO ELIX: 12.5000 mg | ORAL_SOLUTION | Freq: Four times a day (QID) | ORAL | Status: DC | PRN

## 2014-04-07 MED ORDER — PROPOFOL 10 MG/ML IV EMUL
INTRAVENOUS | Status: AC
  Filled 2014-04-07: qty 20

## 2014-04-07 MED ORDER — LACTATED RINGERS IV SOLN
INTRAVENOUS | Status: DC
  Administered 2014-04-07 (×5): via INTRAVENOUS

## 2014-04-07 MED ORDER — MEPERIDINE HCL 25 MG/ML IJ SOLN: 6.2500 mg | INTRAMUSCULAR | Status: DC | PRN

## 2014-04-07 MED ORDER — PROPOFOL 10 MG/ML IV BOLUS
INTRAVENOUS | Status: DC | PRN
  Administered 2014-04-07: 170 mg via INTRAVENOUS

## 2014-04-07 MED ORDER — SODIUM CHLORIDE 0.9 % IJ SOLN: 9.0000 mL | INTRAMUSCULAR | Status: DC | PRN

## 2014-04-07 MED ORDER — LIDOCAINE HCL (CARDIAC) 20 MG/ML IV SOLN
INTRAVENOUS | Status: AC
  Filled 2014-04-07: qty 5

## 2014-04-07 MED ORDER — CEFAZOLIN SODIUM-DEXTROSE 2-3 GM-% IV SOLR
2.0000 g | INTRAVENOUS | Status: AC
  Administered 2014-04-07: 2 g via INTRAVENOUS

## 2014-04-07 MED ORDER — HYDROMORPHONE HCL PF 1 MG/ML IJ SOLN
INTRAMUSCULAR | Status: AC
  Filled 2014-04-07: qty 1

## 2014-04-07 MED ORDER — HYDROMORPHONE 0.3 MG/ML IV SOLN
INTRAVENOUS | Status: DC
  Administered 2014-04-07: 0.999 mg via INTRAVENOUS
  Administered 2014-04-07: 1.99 mg via INTRAVENOUS
  Administered 2014-04-07: 13:00:00 via INTRAVENOUS
  Administered 2014-04-08: 2.39 mg via INTRAVENOUS
  Administered 2014-04-08: 04:00:00 via INTRAVENOUS
  Filled 2014-04-07 (×2): qty 25

## 2014-04-07 MED ORDER — ROCURONIUM BROMIDE 100 MG/10ML IV SOLN
INTRAVENOUS | Status: AC
  Filled 2014-04-07: qty 1

## 2014-04-07 MED ORDER — NEOSTIGMINE METHYLSULFATE 10 MG/10ML IV SOLN
INTRAVENOUS | Status: AC
  Filled 2014-04-07: qty 1

## 2014-04-07 MED ORDER — NEOSTIGMINE METHYLSULFATE 10 MG/10ML IV SOLN
INTRAVENOUS | Status: DC | PRN
  Administered 2014-04-07: 3 mg via INTRAVENOUS

## 2014-04-07 MED ORDER — FENTANYL CITRATE 0.05 MG/ML IJ SOLN
INTRAMUSCULAR | Status: AC
  Filled 2014-04-07: qty 5

## 2014-04-07 MED ORDER — CEFAZOLIN SODIUM-DEXTROSE 2-3 GM-% IV SOLR
INTRAVENOUS | Status: AC
  Filled 2014-04-07: qty 50

## 2014-04-07 MED ORDER — LACTATED RINGERS IV SOLN
INTRAVENOUS | Status: DC
  Administered 2014-04-07 – 2014-04-08 (×3): via INTRAVENOUS

## 2014-04-07 MED ORDER — DEXAMETHASONE SODIUM PHOSPHATE 10 MG/ML IJ SOLN
INTRAMUSCULAR | Status: AC
  Filled 2014-04-07: qty 1

## 2014-04-07 MED ORDER — SODIUM CHLORIDE 0.9 % IJ SOLN
INTRAMUSCULAR | Status: AC
  Filled 2014-04-07: qty 50

## 2014-04-07 MED ORDER — ACETAMINOPHEN 325 MG PO TABS: 650.0000 mg | ORAL_TABLET | ORAL | Status: DC | PRN

## 2014-04-07 MED ORDER — HYDROMORPHONE HCL PF 1 MG/ML IJ SOLN
INTRAMUSCULAR | Status: DC | PRN
  Administered 2014-04-07: 1 mg via INTRAVENOUS

## 2014-04-07 MED ORDER — MIDAZOLAM HCL 2 MG/2ML IJ SOLN
INTRAMUSCULAR | Status: AC
  Filled 2014-04-07: qty 2

## 2014-04-07 MED ORDER — NALOXONE HCL 0.4 MG/ML IJ SOLN: 0.4000 mg | INTRAMUSCULAR | Status: DC | PRN

## 2014-04-07 MED ORDER — METOCLOPRAMIDE HCL 5 MG/ML IJ SOLN: 10.0000 mg | Freq: Once | INTRAMUSCULAR | Status: DC | PRN

## 2014-04-07 MED ORDER — KETOROLAC TROMETHAMINE 30 MG/ML IJ SOLN
INTRAMUSCULAR | Status: AC
  Filled 2014-04-07: qty 1

## 2014-04-07 MED ORDER — FENTANYL CITRATE 0.05 MG/ML IJ SOLN
INTRAMUSCULAR | Status: DC | PRN
  Administered 2014-04-07: 50 ug via INTRAVENOUS
  Administered 2014-04-07: 100 ug via INTRAVENOUS
  Administered 2014-04-07: 50 ug via INTRAVENOUS
  Administered 2014-04-07: 100 ug via INTRAVENOUS
  Administered 2014-04-07 (×2): 50 ug via INTRAVENOUS

## 2014-04-07 MED ORDER — MIDAZOLAM HCL 2 MG/2ML IJ SOLN
INTRAMUSCULAR | Status: DC | PRN
  Administered 2014-04-07: 1 mg via INTRAVENOUS

## 2014-04-07 MED ORDER — MENTHOL 3 MG MT LOZG: 1.0000 | LOZENGE | OROMUCOSAL | Status: DC | PRN

## 2014-04-07 MED ORDER — ONDANSETRON HCL 4 MG/2ML IJ SOLN
INTRAMUSCULAR | Status: AC
  Filled 2014-04-07: qty 2

## 2014-04-07 MED ORDER — DEXAMETHASONE SODIUM PHOSPHATE 10 MG/ML IJ SOLN
INTRAMUSCULAR | Status: DC | PRN
  Administered 2014-04-07: 4 mg via INTRAVENOUS

## 2014-04-07 MED ORDER — OXYCODONE-ACETAMINOPHEN 5-325 MG PO TABS
1.0000 | ORAL_TABLET | ORAL | Status: DC | PRN
  Administered 2014-04-08 – 2014-04-09 (×4): 2 via ORAL
  Filled 2014-04-07 (×4): qty 2

## 2014-04-07 MED ORDER — GLYCOPYRROLATE 0.2 MG/ML IJ SOLN
INTRAMUSCULAR | Status: AC
  Filled 2014-04-07: qty 3

## 2014-04-07 MED ORDER — BUPIVACAINE LIPOSOME 1.3 % IJ SUSP
20.0000 mL | Freq: Once | INTRAMUSCULAR | Status: DC
  Filled 2014-04-07: qty 20

## 2014-04-07 MED ORDER — ONDANSETRON HCL 4 MG/2ML IJ SOLN
INTRAMUSCULAR | Status: DC | PRN
  Administered 2014-04-07: 4 mg via INTRAVENOUS

## 2014-04-07 MED ORDER — SCOPOLAMINE 1 MG/3DAYS TD PT72
MEDICATED_PATCH | TRANSDERMAL | Status: AC
  Filled 2014-04-07: qty 1

## 2014-04-07 MED ORDER — LIDOCAINE HCL (CARDIAC) 20 MG/ML IV SOLN
INTRAVENOUS | Status: DC | PRN
  Administered 2014-04-07: 70 mg via INTRAVENOUS
  Administered 2014-04-07: 30 mg via INTRAVENOUS

## 2014-04-07 MED ORDER — DIPHENHYDRAMINE HCL 50 MG/ML IJ SOLN: 12.5000 mg | Freq: Four times a day (QID) | INTRAMUSCULAR | Status: DC | PRN

## 2014-04-07 SURGICAL SUPPLY — 28 items
CANISTER SUCT 3000ML (MISCELLANEOUS) ×2 IMPLANT
CLOTH BEACON ORANGE TIMEOUT ST (SAFETY) ×2 IMPLANT
DRAPE WARM FLUID 44X44 (DRAPE) ×2 IMPLANT
DRSG OPSITE POSTOP 4X10 (GAUZE/BANDAGES/DRESSINGS) ×2 IMPLANT
DURAPREP 26ML APPLICATOR (WOUND CARE) ×2 IMPLANT
GLOVE BIO SURGEON STRL SZ7 (GLOVE) ×2 IMPLANT
GLOVE BIOGEL PI IND STRL 7.0 (GLOVE) ×2 IMPLANT
GLOVE BIOGEL PI INDICATOR 7.0 (GLOVE) ×2
GOWN STRL REUS W/TWL LRG LVL3 (GOWN DISPOSABLE) ×6 IMPLANT
NEEDLE HYPO 22GX1.5 SAFETY (NEEDLE) ×2 IMPLANT
NS IRRIG 1000ML POUR BTL (IV SOLUTION) ×2 IMPLANT
PACK ABDOMINAL GYN (CUSTOM PROCEDURE TRAY) ×2 IMPLANT
PAD OB MATERNITY 4.3X12.25 (PERSONAL CARE ITEMS) ×2 IMPLANT
PROTECTOR NERVE ULNAR (MISCELLANEOUS) ×2 IMPLANT
SPONGE LAP 18X18 X RAY DECT (DISPOSABLE) ×4 IMPLANT
STAPLER VISISTAT 35W (STAPLE) ×2 IMPLANT
STRIP CLOSURE SKIN 1/4X4 (GAUZE/BANDAGES/DRESSINGS) ×2 IMPLANT
SUT PDS AB 0 CT 36 (SUTURE) ×2 IMPLANT
SUT VIC AB 0 CT1 18XCR BRD8 (SUTURE) ×3 IMPLANT
SUT VIC AB 0 CT1 27 (SUTURE) ×2
SUT VIC AB 0 CT1 27XBRD ANBCTR (SUTURE) ×1 IMPLANT
SUT VIC AB 0 CT1 8-18 (SUTURE) ×3
SUT VIC AB 2-0 CT1 (SUTURE) ×2 IMPLANT
SUT VICRYL 0 TIES 12 18 (SUTURE) ×2 IMPLANT
SYR 30ML LL (SYRINGE) ×2 IMPLANT
TOWEL OR 17X24 6PK STRL BLUE (TOWEL DISPOSABLE) ×4 IMPLANT
TRAY FOLEY CATH 14FR (SET/KITS/TRAYS/PACK) ×2 IMPLANT
WATER STERILE IRR 1000ML POUR (IV SOLUTION) ×2 IMPLANT

## 2014-04-07 NOTE — Anesthesia Postprocedure Evaluation (Signed)
  Anesthesia Post-op Note  Patient: Katie [name]  Procedure(s) Performed: Procedure(s): HYSTERECTOMY ABDOMINAL (N/A)  Patient Location: PACU and [facility]  Anesthesia Type:General  Level of Consciousness: awake, alert , oriented and patient cooperative  Airway and Oxygen Therapy: Patient Spontanous Breathing and Patient connected to nasal cannula oxygen  Post-op Pain: mild  Post-op Assessment: Post-op Vital signs reviewed, Patient's Cardiovascular Status Stable, Respiratory Function Stable, Patent Airway, No signs of Nausea or vomiting, Adequate PO intake and Pain level controlled  Post-op Vital Signs: Reviewed and stable  Last Vitals:  Filed Vitals:   04/07/14 2131  BP: 133/81  Pulse: 99  Temp: 36.6 C  Resp: 14    Complications: No apparent anesthesia complications

## 2014-04-07 NOTE — Transfer of Care (Signed)
Immediate Anesthesia Transfer of Care Note  Patient: Katie [name]  Procedure(s) Performed: Procedure(s): HYSTERECTOMY ABDOMINAL (N/A)  Patient Location: PACU  Anesthesia Type:General  Level of Consciousness: awake, sedated and patient cooperative  Airway & Oxygen Therapy: Patient Spontanous Breathing and Patient connected to nasal cannula oxygen  Post-op Assessment: Report given to PACU RN and Post -op Vital signs reviewed and stable  Post vital signs: Reviewed and stable  Complications: No apparent anesthesia complications

## 2014-04-07 NOTE — Brief Op Note (Signed)
04/07/2014  9:19 AM  PATIENT:  Murlean Hark  43 y.o. female  PRE-OPERATIVE DIAGNOSIS:  Fibroids  POST-OPERATIVE DIAGNOSIS:  Fibroids  PROCEDURE:  Procedure(s): HYSTERECTOMY ABDOMINAL (N/A)  SURGEON:  Surgeon(s) and Role:    * Darlyn Chamber, MD - Primary    * Marylynn Pearson, MD - Assisting  PHYSICIAN ASSISTANT:   ASSISTANTS: adkims   ANESTHESIA:   general  EBL:  Total I/O In: 3000 [I.V.:3000] Out: 1700 [Urine:500; Blood:1200]  BLOOD ADMINISTERED:none  DRAINS: Urinary Catheter (Foley)   LOCAL MEDICATIONS USED:  NONE  SPECIMEN:  Uterus left tube and ovary  Right fallopian tube  DISPOSITION OF SPECIMEN:  PATHOLOGY  COUNTS:  YES  TOURNIQUET:  * No tourniquets in log *  DICTATION: .Other Dictation: Dictation Number [id number]  PLAN OF CARE: Admit to inpatient   PATIENT DISPOSITION:  PACU - hemodynamically stable.   Delay start of Pharmacological VTE agent (>24hrs) due to surgical blood loss or risk of bleeding: no

## 2014-04-07 NOTE — Anesthesia Postprocedure Evaluation (Signed)
  Anesthesia Post-op Note  Anesthesia Post Note  Patient: Katie [name]  Procedure(s) Performed: Procedure(s) (LRB): HYSTERECTOMY ABDOMINAL (N/A)  Anesthesia type: General  Patient location: PACU  Post pain: Pain level controlled  Post assessment: Post-op Vital signs reviewed  Last Vitals:  Filed Vitals:   04/07/14 1015  BP: 138/91  Pulse: 112  Temp:   Resp: 15    Post vital signs: Reviewed  Level of consciousness: sedated  Complications: No apparent anesthesia complications

## 2014-04-07 NOTE — Addendum Note (Signed)
Addendum created 04/07/14 2248 by Alvy Bimler, CRNA   Modules edited: Notes Section   Notes Section:  File: [id number]

## 2014-04-07 NOTE — Op Note (Signed)
NAME:  Katie [name], Katie [name] NO.:  [id number]  MEDICAL RECORD NO.:  [id number]  LOCATION:  [facility]                          FACILITY:  [facility]  PHYSICIAN:  Darlyn Chamber, M.D.   DATE OF BIRTH:  09-23-1970  DATE OF PROCEDURE:  04/07/2014 DATE OF DISCHARGE:                              OPERATIVE REPORT   PREOPERATIVE DIAGNOSES:  Uterine fibroids with associated menorrhagia and anemia.  POSTOPERATIVE DIAGNOSES:  Uterine fibroids with associated menorrhagia and anemia.  OPERATIVE PROCEDURE:  Total abdominal hysterectomy with left salpingo- oophorectomy and removal of right fallopian tube.  SURGEON:  Darlyn Chamber, M.D.  ASSISTANT:  Marylynn Pearson, MD.  ANESTHESIA:  General endotracheal.  BLOOD LOSS:  1200 mL.  PACKS:  None.  DRAINS:  Included urethral Foley.  INTRAOPERATIVE BLOOD PLACEMENT:  None.  COMPLICATIONS:  None.  INDICATION:  Dictated in history and physical.  PROCEDURE IN DETAIL:  The patient was taken to the OR and placed in supine position.  After satisfactory level of general endotracheal anesthesia obtained, the perineum and vagina prepped out Betadine.  A Foley was placed to straight drain.  Her abdomen was prepped out with DuraPrep after satisfactory time of drying, it was draped as a sterile field.  A low-transverse skin incision was made with a knife and carried through the subcutaneous tissue.  Fascia was entered sharply.  Incision was fashioned laterally.  Fascia taken off the muscle superiorly and inferiorly.  Rectus muscles were separated in midline.  Anterior perineum was entered sharply.  Incision of the perineum was extended both superiorly and inferiorly.  Uterus was enlarged, approximately 20 weeks with multiple fibroids.  It was delivered through the uterine incision.  Both tubes and ovaries were unremarkable.  First, the right round ligament was clamped, cut, and suture ligated with 0 Vicryl.  The right utero-ovarian pedicle was  isolated, clamped, cut, doubly ligated, first with a free tie of 0 Vicryl, then a suture ligature of 0 Vicryl. A bladder flap was developed on the right side.  Right uterine vessels were clamped, cut, and suture ligated with 0 Vicryl.  Next, on the left side, the left round ligament was clamped, cut, and suture ligated with 0 Vicryl.  The left utero-ovarian pedicle was isolated, clamped, cut, and tied with a free tie of 0 Vicryl and suture ligature of 0 Vicryl. On the left side, the fallopian tube was removed.  At this point in time, we developed the bladder flap.  The left uterine vessels were clamped, cut, and suture ligated with 0-Vicryl.  We then excised the uterine fundus from the lower uterine segment.  We continued using clamp, cut, and tie technique and suture ligated with 0 Vicryl, separate the parametrium from the sides of the lower uterine segment.  There was 1 large fibroid in the segment, it was also excised.  At this point in time, this left the cervical stump.  We continued to clamp, cut, and suture ligate with 0 Vicryl.  Vaginal angles were then clamped and cut. The intervening vaginal mucosa was then excised and the cervix were passed off the operative field.  The vagina was then closed with interrupted figure-of-eight of 0 Vicryl.  We had good hemostasis.  We then put the Fredia Sorrow retractors in place and packed contents superiorly.  We irrigated the pelvis.  In the pelvis, we had fairly good hemostasis.  We then went, took the upper blade out, removed the packing revealing the left ovary.  It had bleeding coming from the ovarian pedicle.  Attempts to control this were unsuccessful.  At this point in time, we identified the ureter along the left pelvic sidewall, isolated the ovarian vasculature above it, clamped and cut and the ovaries passed off the operative field.  All pedicles were secured with free tie of 0 Vicryl and suture of 0 Vicryl.  Right ovary  was hemostatic.  We decided to remove the right fallopian tube.  This was done by clamping with a Claiborne Billings, excising it, and removing the fallopian tube and securing the clamped vessels with a suture ligature of 0 Vicryl.  Some bleeding was encountered that were controlled with a running suture of 3-0 Vicryl.  We had a very long pedicle on the right side that we would have been amenable torsion.  We used the right round ligament and secured the ovary to that.  We then did a running suture of 3-0 Vicryl to close the peritoneum between the utero-ovarian pedicle and the right pelvic side wall.  With this, no opening space was occurred. We then irrigated the pelvis, had good hemostasis.  Appendix appeared to be normal.  There was no upper abdomen lesions.  All packs removed.  The Fredia Sorrow retractors were then removed.  The peritoneum was closed with running suture of 3-0 Vicryl.  Fascia was closed in running suture of 0 PDS.  Skin was closed with staples.  Sponges, instrument, and needle count was reported as correct by circulating nurse x3.  Foley catheter remained clear at the time of closure.  The patient did tolerate the procedure well and returned to the recovery room in good condition.     Darlyn Chamber, M.D.     JSM/MEDQ  D:  04/07/2014  T:  04/07/2014  Job:  [id number]

## 2014-04-07 NOTE — H&P (Signed)
  History and physical exam unchanged 

## 2014-04-07 NOTE — Op Note (Signed)
Patient name  Katie [name], Katie [name] DICTATION#  [id number] CSN# [id number]  [facility], MD 04/07/2014 9:20 AM

## 2014-04-08 ENCOUNTER — Encounter (HOSPITAL_COMMUNITY): Payer: Self-pay | Admitting: Obstetrics and Gynecology

## 2014-04-08 LAB — CBC
HCT: 27.3 % — ABNORMAL LOW (ref 36.0–46.0)
Hemoglobin: 9.1 g/dL — ABNORMAL LOW (ref 12.0–15.0)
MCH: 28.2 pg (ref 26.0–34.0)
MCHC: 33.3 g/dL (ref 30.0–36.0)
MCV: 84.5 fL (ref 78.0–100.0)
Platelets: 305 10*3/uL (ref 150–400)
RBC: 3.23 MIL/uL — ABNORMAL LOW (ref 3.87–5.11)
RDW: 17.1 % — ABNORMAL HIGH (ref 11.5–15.5)
WBC: 9.3 10*3/uL (ref 4.0–10.5)

## 2014-04-08 LAB — PREPARE RBC (CROSSMATCH)

## 2014-04-08 MED ORDER — ACETAMINOPHEN 325 MG PO TABS: 650.0000 mg | ORAL_TABLET | Freq: Once | ORAL | Status: DC

## 2014-04-08 MED ORDER — DIPHENHYDRAMINE HCL 25 MG PO CAPS
25.0000 mg | ORAL_CAPSULE | Freq: Once | ORAL | Status: AC
  Administered 2014-04-08: 25 mg via ORAL
  Filled 2014-04-08: qty 1

## 2014-04-08 NOTE — Progress Notes (Signed)
1 Day Post-Op Procedure([name]) (LRB): HYSTERECTOMY ABDOMINAL (N/A)  Subjective: Patient reports nausea, incisional pain and + flatus.    Objective: I have reviewed patient'[name] vital signs and labs.hgb 5.9  General: alert GI: soft, non-tender; bowel sounds normal; no masses,  no organomegaly and incision: intact Vaginal Bleeding: minimal  Assessment: [name]/p Procedure([name]): HYSTERECTOMY ABDOMINAL (N/A): stable and anemia  Plan: transfuse two units of prbc.  risks discussed including risk of AID'[name] or hepetitis.  risk  of transfusion reaction  LOS: 1 day    Katie [name] 04/08/2014, 7:34 AM

## 2014-04-08 NOTE — Progress Notes (Signed)
CRITICAL VALUE ALERT  Critical value received:  5.9  Date of notification:  04/08/14  Time of notification:  9728  Critical value read back: yes  Nurse who received alert:  Ivin Poot, RN   MD notified (1st page):  [place]  Time of first page:  0547  MD notified (2nd page):   Time of second page:  Responding MD:  Gaetano Net  Time MD responded:  [phone]

## 2014-04-08 NOTE — Progress Notes (Signed)
Patient ID: Katie [name], female   DOB: 1970-10-30, 43 y.o.   MRN: [id number] JUST FINISHED BLOOD TRANSFUSIONS ABLE TO AMBULATE TOLERATING PO'S VSS CHECK CBC

## 2014-04-09 LAB — TYPE AND SCREEN
ABO/RH(D): O POS
Antibody Screen: NEGATIVE
Unit division: 0
Unit division: 0
Unit division: 0
Unit division: 0
Unit division: 0
Unit division: 0

## 2014-04-09 LAB — CBC
HCT: 18.5 % — ABNORMAL LOW (ref 36.0–46.0)
HCT: 25.5 % — ABNORMAL LOW (ref 36.0–46.0)
Hemoglobin: 5.9 g/dL — CL (ref 12.0–15.0)
Hemoglobin: 8.4 g/dL — ABNORMAL LOW (ref 12.0–15.0)
MCH: 26.7 pg (ref 26.0–34.0)
MCH: 27.7 pg (ref 26.0–34.0)
MCHC: 31.9 g/dL (ref 30.0–36.0)
MCHC: 32.9 g/dL (ref 30.0–36.0)
MCV: 83.7 fL (ref 78.0–100.0)
MCV: 84.2 fL (ref 78.0–100.0)
Platelets: 344 10*3/uL (ref 150–400)
Platelets: 288 10*3/uL (ref 150–400)
RBC: 2.21 MIL/uL — ABNORMAL LOW (ref 3.87–5.11)
RBC: 3.03 MIL/uL — ABNORMAL LOW (ref 3.87–5.11)
RDW: 19.1 % — ABNORMAL HIGH (ref 11.5–15.5)
RDW: 17.4 % — ABNORMAL HIGH (ref 11.5–15.5)
WBC: 11.2 10*3/uL — ABNORMAL HIGH (ref 4.0–10.5)
WBC: 9.2 10*3/uL (ref 4.0–10.5)

## 2014-04-09 MED ORDER — BISACODYL 10 MG RE SUPP
10.0000 mg | Freq: Once | RECTAL | Status: AC
  Administered 2014-04-09: 10 mg via RECTAL
  Filled 2014-04-09: qty 1

## 2014-04-09 MED ORDER — OXYCODONE-ACETAMINOPHEN 7.5-325 MG PO TABS: 1.0000 | ORAL_TABLET | ORAL | Status: DC | PRN

## 2014-04-09 NOTE — Progress Notes (Signed)
Pt discharged home with children... Discharge instructions reviewed and pt verbalized understanding... Condition stable... No equipment... Taken to car via wheelchair by L. Graylon Good, NT.

## 2014-04-09 NOTE — Progress Notes (Signed)
2 Days Post-Op Procedure(s) (LRB): HYSTERECTOMY ABDOMINAL (N/A)  Subjective: Patient reports tolerating PO and no problems voiding.    Objective: I have reviewed patient's vital signs and labs.  General: alert GI: soft, non-tender; bowel sounds normal; no masses,  no organomegaly and incision: clean Vaginal Bleeding: minimal  Assessment: s/p Procedure(s): HYSTERECTOMY ABDOMINAL (N/A): stable  Plan: Discharge home  LOS: 2 days    Camylle Whicker S 04/09/2014, 6:52 AM

## 2014-04-09 NOTE — Discharge Summary (Signed)
Admitting diagnosis: Uterine fibroids with associated menorrhagia and anemia.  Discharge diagnoses: The same  Operative procedure: Total abdominal hysterectomy. Left salpingo-oophorectomy. Removal of right fallopian tube.  Her complete history and physical please see dictated note.  Course the hospital: The patient was brought in and underwent the above-noted surgery. He does of bleeding from the left ovarian pedicle the left ovary and tube were removed. The right ovary was left in place although the right fallopian tube was removed. We did extend to approximately 1200 cc of blood loss. Her hemoglobin that afternoon was 7.2. The following morning was 5.9. She received 2 units of packed red blood cells. That afternoon her hemoglobin had come up to 9.1. The time of discharge she was tolerating a regular diet and ambulating without difficulty. Her Foley had been discontinued she was voiding without difficulty. Bowel sounds were active although she had not passed flatus as of that point in time. The morning discharge she did pass one clot. There was no active vaginal bleeding afterwards. On her exam her abdomen was soft bowel sounds were active. Her incision was intact. Red check a CBC this morning before discharge. We'll also give her a Dulcolax suppository.  In terms of complications: She did have excessive blood loss during the procedure requiring transfusion of 2 units of blood cells.  Patient is discharged home in stable condition  Disposition: She discharged home on Percocet and she needs it for pain. She has iron sulfate supplementation at home. She is to avoid heavy lifting. She should not be driving a car. She is to avoid vaginal entrance. She should call with any signs of infection. She report any vaginal bleeding. Nausea vomiting should be reported. Excessive pain should be reported. Incisional redness or drainage should be reported. So instructed in signs and symptoms of deep venous thrombosis  and pulmonary embolus.

## 2014-07-17 ENCOUNTER — Other Ambulatory Visit: Payer: Self-pay | Admitting: Gastroenterology

## 2014-10-09 ENCOUNTER — Other Ambulatory Visit: Payer: Self-pay | Admitting: Otolaryngology

## 2016-01-26 DIAGNOSIS — N760 Acute vaginitis: Secondary | ICD-10-CM | POA: Diagnosis not present

## 2016-01-26 DIAGNOSIS — B9689 Other specified bacterial agents as the cause of diseases classified elsewhere: Secondary | ICD-10-CM | POA: Diagnosis not present

## 2016-01-26 DIAGNOSIS — L298 Other pruritus: Secondary | ICD-10-CM | POA: Diagnosis not present

## 2016-01-31 DIAGNOSIS — R350 Frequency of micturition: Secondary | ICD-10-CM | POA: Diagnosis not present

## 2016-11-14 DIAGNOSIS — B379 Candidiasis, unspecified: Secondary | ICD-10-CM | POA: Diagnosis not present

## 2016-11-14 DIAGNOSIS — R3915 Urgency of urination: Secondary | ICD-10-CM | POA: Diagnosis not present

## 2017-03-19 DIAGNOSIS — B379 Candidiasis, unspecified: Secondary | ICD-10-CM | POA: Diagnosis not present

## 2017-03-19 DIAGNOSIS — L298 Other pruritus: Secondary | ICD-10-CM | POA: Diagnosis not present

## 2017-06-07 DIAGNOSIS — R635 Abnormal weight gain: Secondary | ICD-10-CM | POA: Diagnosis not present

## 2017-06-07 DIAGNOSIS — Z6834 Body mass index (BMI) 34.0-34.9, adult: Secondary | ICD-10-CM | POA: Diagnosis not present

## 2017-06-07 DIAGNOSIS — R600 Localized edema: Secondary | ICD-10-CM | POA: Diagnosis not present

## 2017-06-25 DIAGNOSIS — Z0000 Encounter for general adult medical examination without abnormal findings: Secondary | ICD-10-CM | POA: Diagnosis not present

## 2017-06-25 DIAGNOSIS — Z13220 Encounter for screening for lipoid disorders: Secondary | ICD-10-CM | POA: Diagnosis not present

## 2017-06-25 DIAGNOSIS — Z114 Encounter for screening for human immunodeficiency virus [HIV]: Secondary | ICD-10-CM | POA: Diagnosis not present

## 2017-06-25 DIAGNOSIS — Z1329 Encounter for screening for other suspected endocrine disorder: Secondary | ICD-10-CM | POA: Diagnosis not present

## 2017-06-28 ENCOUNTER — Other Ambulatory Visit: Payer: Self-pay | Admitting: Family Medicine

## 2017-06-28 DIAGNOSIS — Z118 Encounter for screening for other infectious and parasitic diseases: Secondary | ICD-10-CM | POA: Diagnosis not present

## 2017-06-28 DIAGNOSIS — Z1231 Encounter for screening mammogram for malignant neoplasm of breast: Secondary | ICD-10-CM

## 2017-06-28 DIAGNOSIS — G4709 Other insomnia: Secondary | ICD-10-CM | POA: Diagnosis not present

## 2017-06-28 DIAGNOSIS — Z6835 Body mass index (BMI) 35.0-35.9, adult: Secondary | ICD-10-CM | POA: Diagnosis not present

## 2017-06-28 DIAGNOSIS — R635 Abnormal weight gain: Secondary | ICD-10-CM | POA: Diagnosis not present

## 2017-06-28 DIAGNOSIS — Z0000 Encounter for general adult medical examination without abnormal findings: Secondary | ICD-10-CM | POA: Diagnosis not present

## 2017-06-28 DIAGNOSIS — N760 Acute vaginitis: Secondary | ICD-10-CM | POA: Diagnosis not present

## 2017-06-28 DIAGNOSIS — F331 Major depressive disorder, recurrent, moderate: Secondary | ICD-10-CM | POA: Diagnosis not present

## 2017-07-17 ENCOUNTER — Ambulatory Visit
Admission: RE | Admit: 2017-07-17 | Discharge: 2017-07-17 | Disposition: A | Discharge status: Home / Self Care | Payer: BLUE CROSS/BLUE SHIELD | Source: Ambulatory Visit | Attending: Family Medicine | Admitting: Family Medicine

## 2017-07-17 DIAGNOSIS — Z1231 Encounter for screening mammogram for malignant neoplasm of breast: Secondary | ICD-10-CM

## 2017-07-19 DIAGNOSIS — F331 Major depressive disorder, recurrent, moderate: Secondary | ICD-10-CM | POA: Diagnosis not present

## 2017-07-19 DIAGNOSIS — F411 Generalized anxiety disorder: Secondary | ICD-10-CM | POA: Diagnosis not present

## 2017-07-19 DIAGNOSIS — Z6835 Body mass index (BMI) 35.0-35.9, adult: Secondary | ICD-10-CM | POA: Diagnosis not present

## 2017-07-19 DIAGNOSIS — J3489 Other specified disorders of nose and nasal sinuses: Secondary | ICD-10-CM | POA: Diagnosis not present

## 2017-08-16 DIAGNOSIS — D239 Other benign neoplasm of skin, unspecified: Secondary | ICD-10-CM | POA: Diagnosis not present

## 2017-10-18 DIAGNOSIS — Z6835 Body mass index (BMI) 35.0-35.9, adult: Secondary | ICD-10-CM | POA: Diagnosis not present

## 2017-10-18 DIAGNOSIS — F331 Major depressive disorder, recurrent, moderate: Secondary | ICD-10-CM | POA: Diagnosis not present

## 2017-11-17 DIAGNOSIS — J0100 Acute maxillary sinusitis, unspecified: Secondary | ICD-10-CM | POA: Diagnosis not present

## 2017-11-17 DIAGNOSIS — B373 Candidiasis of vulva and vagina: Secondary | ICD-10-CM | POA: Diagnosis not present

## 2018-01-23 DIAGNOSIS — R3121 Asymptomatic microscopic hematuria: Secondary | ICD-10-CM | POA: Diagnosis not present

## 2018-01-23 DIAGNOSIS — A609 Anogenital herpesviral infection, unspecified: Secondary | ICD-10-CM | POA: Diagnosis not present

## 2018-01-23 DIAGNOSIS — N760 Acute vaginitis: Secondary | ICD-10-CM | POA: Diagnosis not present

## 2018-04-09 DIAGNOSIS — D485 Neoplasm of uncertain behavior of skin: Secondary | ICD-10-CM | POA: Diagnosis not present

## 2018-04-09 DIAGNOSIS — D2239 Melanocytic nevi of other parts of face: Secondary | ICD-10-CM | POA: Diagnosis not present

## 2018-04-09 DIAGNOSIS — L819 Disorder of pigmentation, unspecified: Secondary | ICD-10-CM | POA: Diagnosis not present

## 2018-04-18 DIAGNOSIS — Z6835 Body mass index (BMI) 35.0-35.9, adult: Secondary | ICD-10-CM | POA: Diagnosis not present

## 2018-04-18 DIAGNOSIS — F331 Major depressive disorder, recurrent, moderate: Secondary | ICD-10-CM | POA: Diagnosis not present

## 2018-06-04 DIAGNOSIS — Z6835 Body mass index (BMI) 35.0-35.9, adult: Secondary | ICD-10-CM | POA: Diagnosis not present

## 2018-06-04 DIAGNOSIS — R0789 Other chest pain: Secondary | ICD-10-CM | POA: Diagnosis not present

## 2018-06-04 DIAGNOSIS — R079 Chest pain, unspecified: Secondary | ICD-10-CM | POA: Diagnosis not present

## 2018-06-04 DIAGNOSIS — E782 Mixed hyperlipidemia: Secondary | ICD-10-CM | POA: Diagnosis not present

## 2018-06-04 DIAGNOSIS — R600 Localized edema: Secondary | ICD-10-CM | POA: Diagnosis not present

## 2018-06-29 DIAGNOSIS — R8281 Pyuria: Secondary | ICD-10-CM | POA: Diagnosis not present

## 2018-06-29 DIAGNOSIS — N760 Acute vaginitis: Secondary | ICD-10-CM | POA: Diagnosis not present

## 2018-07-01 DIAGNOSIS — Z0000 Encounter for general adult medical examination without abnormal findings: Secondary | ICD-10-CM | POA: Diagnosis not present

## 2018-07-01 DIAGNOSIS — Z1329 Encounter for screening for other suspected endocrine disorder: Secondary | ICD-10-CM | POA: Diagnosis not present

## 2018-07-01 DIAGNOSIS — Z13220 Encounter for screening for lipoid disorders: Secondary | ICD-10-CM | POA: Diagnosis not present

## 2018-07-01 DIAGNOSIS — Z114 Encounter for screening for human immunodeficiency virus [HIV]: Secondary | ICD-10-CM | POA: Diagnosis not present

## 2018-07-08 DIAGNOSIS — Z6834 Body mass index (BMI) 34.0-34.9, adult: Secondary | ICD-10-CM | POA: Diagnosis not present

## 2018-07-08 DIAGNOSIS — I10 Essential (primary) hypertension: Secondary | ICD-10-CM | POA: Diagnosis not present

## 2018-07-08 DIAGNOSIS — Z0000 Encounter for general adult medical examination without abnormal findings: Secondary | ICD-10-CM | POA: Diagnosis not present

## 2018-07-17 DIAGNOSIS — F411 Generalized anxiety disorder: Secondary | ICD-10-CM | POA: Diagnosis not present

## 2018-07-19 DIAGNOSIS — R600 Localized edema: Secondary | ICD-10-CM | POA: Diagnosis not present

## 2018-07-19 DIAGNOSIS — M67824 Other specified disorders of tendon, left elbow: Secondary | ICD-10-CM | POA: Diagnosis not present

## 2018-07-19 DIAGNOSIS — M7712 Lateral epicondylitis, left elbow: Secondary | ICD-10-CM | POA: Diagnosis not present

## 2018-07-29 DIAGNOSIS — Z0189 Encounter for other specified special examinations: Secondary | ICD-10-CM | POA: Diagnosis not present

## 2018-07-29 DIAGNOSIS — R0789 Other chest pain: Secondary | ICD-10-CM | POA: Diagnosis not present

## 2018-07-29 DIAGNOSIS — E785 Hyperlipidemia, unspecified: Secondary | ICD-10-CM | POA: Diagnosis not present

## 2018-07-31 DIAGNOSIS — F411 Generalized anxiety disorder: Secondary | ICD-10-CM | POA: Diagnosis not present

## 2018-08-16 DIAGNOSIS — M67824 Other specified disorders of tendon, left elbow: Secondary | ICD-10-CM | POA: Diagnosis not present

## 2018-08-16 DIAGNOSIS — M7712 Lateral epicondylitis, left elbow: Secondary | ICD-10-CM | POA: Diagnosis not present

## 2018-08-16 DIAGNOSIS — B373 Candidiasis of vulva and vagina: Secondary | ICD-10-CM | POA: Diagnosis not present

## 2018-08-16 DIAGNOSIS — N760 Acute vaginitis: Secondary | ICD-10-CM | POA: Diagnosis not present

## 2018-09-12 DIAGNOSIS — F411 Generalized anxiety disorder: Secondary | ICD-10-CM | POA: Diagnosis not present

## 2018-09-24 DIAGNOSIS — F411 Generalized anxiety disorder: Secondary | ICD-10-CM | POA: Diagnosis not present

## 2018-11-08 DIAGNOSIS — M7712 Lateral epicondylitis, left elbow: Secondary | ICD-10-CM | POA: Diagnosis not present

## 2018-11-08 DIAGNOSIS — N760 Acute vaginitis: Secondary | ICD-10-CM | POA: Diagnosis not present

## 2018-11-08 DIAGNOSIS — R635 Abnormal weight gain: Secondary | ICD-10-CM | POA: Diagnosis not present

## 2018-11-08 DIAGNOSIS — B373 Candidiasis of vulva and vagina: Secondary | ICD-10-CM | POA: Diagnosis not present

## 2019-01-14 DIAGNOSIS — N760 Acute vaginitis: Secondary | ICD-10-CM | POA: Diagnosis not present

## 2019-01-14 DIAGNOSIS — N952 Postmenopausal atrophic vaginitis: Secondary | ICD-10-CM | POA: Diagnosis not present

## 2019-01-14 DIAGNOSIS — Z719 Counseling, unspecified: Secondary | ICD-10-CM | POA: Diagnosis not present

## 2019-11-29 ENCOUNTER — Ambulatory Visit: Payer: Self-pay | Attending: Internal Medicine

## 2019-11-29 ENCOUNTER — Other Ambulatory Visit: Payer: Self-pay

## 2019-11-29 DIAGNOSIS — Z23 Encounter for immunization: Secondary | ICD-10-CM

## 2019-11-29 NOTE — Progress Notes (Signed)
   Covid-19 Vaccination Clinic  Name:  Katie [name]    MRN: [id number] DOB: [birth year]  11/29/2019  Katie [name] was observed post Covid-19 immunization for 15 minutes without incident. She was provided with Vaccine Information Sheet and instruction to access the V-Safe system.   Katie [name] was instructed to call 911 with any severe reactions post vaccine: Marland Kitchen Difficulty breathing  . Swelling of face and throat  . A fast heartbeat  . A bad rash all over body  . Dizziness and weakness   Immunizations Administered    Name Date Dose VIS Date Route   Pfizer COVID-19 Vaccine 11/29/2019  8:56 AM 0.3 mL 08/22/2019 Intramuscular   Manufacturer: [place]   Lot: [id number]   [place]: [id number]

## 2019-12-24 ENCOUNTER — Ambulatory Visit: Payer: Self-pay | Attending: Internal Medicine

## 2019-12-24 DIAGNOSIS — Z23 Encounter for immunization: Secondary | ICD-10-CM

## 2019-12-24 NOTE — Progress Notes (Signed)
   Covid-19 Vaccination Clinic  Name:  AGAM WRUBEL    MRN: [id number] DOB: 03-23-1971  12/24/2019  Ms. Scaffidi was observed post Covid-19 immunization for 15 minutes without incident. She was provided with Vaccine Information Sheet and instruction to access the V-Safe system.   Ms. Guastella was instructed to call 911 with any severe reactions post vaccine: Marland Kitchen Difficulty breathing  . Swelling of face and throat  . A fast heartbeat  . A bad rash all over body  . Dizziness and weakness   Immunizations Administered    Name Date Dose VIS Date Route   Pfizer COVID-19 Vaccine 12/24/2019  4:40 PM 0.3 mL 08/22/2019 Intramuscular   Manufacturer: [employer], [employer]   Lot: [id number]   [place]: [id number]

## 2020-01-09 DIAGNOSIS — Z20828 Contact with and (suspected) exposure to other viral communicable diseases: Secondary | ICD-10-CM | POA: Diagnosis not present

## 2020-01-12 DIAGNOSIS — Z0000 Encounter for general adult medical examination without abnormal findings: Secondary | ICD-10-CM | POA: Diagnosis not present

## 2020-01-13 DIAGNOSIS — Z0000 Encounter for general adult medical examination without abnormal findings: Secondary | ICD-10-CM | POA: Diagnosis not present

## 2020-01-13 DIAGNOSIS — Z23 Encounter for immunization: Secondary | ICD-10-CM | POA: Diagnosis not present

## 2020-01-13 DIAGNOSIS — Z01419 Encounter for gynecological examination (general) (routine) without abnormal findings: Secondary | ICD-10-CM | POA: Diagnosis not present

## 2020-01-13 DIAGNOSIS — Z1151 Encounter for screening for human papillomavirus (HPV): Secondary | ICD-10-CM | POA: Diagnosis not present

## 2020-01-13 DIAGNOSIS — Z1211 Encounter for screening for malignant neoplasm of colon: Secondary | ICD-10-CM | POA: Diagnosis not present

## 2020-01-13 DIAGNOSIS — Z118 Encounter for screening for other infectious and parasitic diseases: Secondary | ICD-10-CM | POA: Diagnosis not present

## 2020-01-13 DIAGNOSIS — Z113 Encounter for screening for infections with a predominantly sexual mode of transmission: Secondary | ICD-10-CM | POA: Diagnosis not present

## 2020-01-13 DIAGNOSIS — Z124 Encounter for screening for malignant neoplasm of cervix: Secondary | ICD-10-CM | POA: Diagnosis not present

## 2020-01-13 DIAGNOSIS — Z6834 Body mass index (BMI) 34.0-34.9, adult: Secondary | ICD-10-CM | POA: Diagnosis not present

## 2020-02-25 DIAGNOSIS — Z23 Encounter for immunization: Secondary | ICD-10-CM | POA: Diagnosis not present

## 2020-05-26 DIAGNOSIS — Z20822 Contact with and (suspected) exposure to covid-19: Secondary | ICD-10-CM | POA: Diagnosis not present

## 2020-06-29 DIAGNOSIS — S8002XA Contusion of left knee, initial encounter: Secondary | ICD-10-CM | POA: Diagnosis not present

## 2020-06-29 DIAGNOSIS — R030 Elevated blood-pressure reading, without diagnosis of hypertension: Secondary | ICD-10-CM | POA: Diagnosis not present

## 2020-06-29 DIAGNOSIS — R600 Localized edema: Secondary | ICD-10-CM | POA: Diagnosis not present

## 2020-06-29 DIAGNOSIS — R609 Edema, unspecified: Secondary | ICD-10-CM | POA: Diagnosis not present

## 2020-07-01 DIAGNOSIS — Z23 Encounter for immunization: Secondary | ICD-10-CM | POA: Diagnosis not present

## 2020-07-07 ENCOUNTER — Other Ambulatory Visit: Payer: Self-pay | Admitting: Family Medicine

## 2020-07-07 DIAGNOSIS — Z1231 Encounter for screening mammogram for malignant neoplasm of breast: Secondary | ICD-10-CM

## 2020-08-10 DIAGNOSIS — I10 Essential (primary) hypertension: Secondary | ICD-10-CM | POA: Diagnosis not present

## 2020-08-10 DIAGNOSIS — E669 Obesity, unspecified: Secondary | ICD-10-CM | POA: Diagnosis not present

## 2020-08-10 DIAGNOSIS — R609 Edema, unspecified: Secondary | ICD-10-CM | POA: Diagnosis not present

## 2020-08-17 ENCOUNTER — Other Ambulatory Visit: Payer: Self-pay

## 2020-08-17 ENCOUNTER — Ambulatory Visit
Admission: RE | Admit: 2020-08-17 | Discharge: 2020-08-17 | Disposition: A | Discharge status: Home / Self Care | Payer: Self-pay | Source: Ambulatory Visit | Attending: Family Medicine | Admitting: Family Medicine

## 2020-08-17 DIAGNOSIS — Z1231 Encounter for screening mammogram for malignant neoplasm of breast: Secondary | ICD-10-CM | POA: Diagnosis not present

## 2021-01-10 DIAGNOSIS — Z0000 Encounter for general adult medical examination without abnormal findings: Secondary | ICD-10-CM | POA: Diagnosis not present

## 2021-01-10 DIAGNOSIS — E782 Mixed hyperlipidemia: Secondary | ICD-10-CM | POA: Diagnosis not present

## 2021-01-13 DIAGNOSIS — Z1211 Encounter for screening for malignant neoplasm of colon: Secondary | ICD-10-CM | POA: Diagnosis not present

## 2021-01-13 DIAGNOSIS — Z0000 Encounter for general adult medical examination without abnormal findings: Secondary | ICD-10-CM | POA: Diagnosis not present

## 2021-01-13 DIAGNOSIS — Z23 Encounter for immunization: Secondary | ICD-10-CM | POA: Diagnosis not present

## 2021-09-20 ENCOUNTER — Other Ambulatory Visit: Payer: Self-pay | Admitting: Family Medicine

## 2021-09-20 DIAGNOSIS — Z1231 Encounter for screening mammogram for malignant neoplasm of breast: Secondary | ICD-10-CM

## 2021-10-11 ENCOUNTER — Ambulatory Visit
Admission: RE | Admit: 2021-10-11 | Discharge: 2021-10-11 | Disposition: A | Discharge status: Home / Self Care | Payer: BC Managed Care – PPO | Source: Ambulatory Visit | Attending: Family Medicine | Admitting: Family Medicine

## 2021-10-11 DIAGNOSIS — Z1231 Encounter for screening mammogram for malignant neoplasm of breast: Secondary | ICD-10-CM | POA: Diagnosis not present

## 2022-05-10 IMAGING — MG MM DIGITAL SCREENING BILAT W/ TOMO AND CAD
6 of 12 series · 6 of 36 positions shown · non-contrast
Comparison: Previous exam(s).

CLINICAL DATA: Screening.

EXAM:
DIGITAL SCREENING BILATERAL MAMMOGRAM WITH TOMOSYNTHESIS AND CAD
TECHNIQUE: Bilateral screening digital craniocaudal and mediolateral oblique
mammograms were obtained. Bilateral screening digital breast
tomosynthesis was performed. The images were evaluated with
computer-aided detection.

[L CC synth-2D]
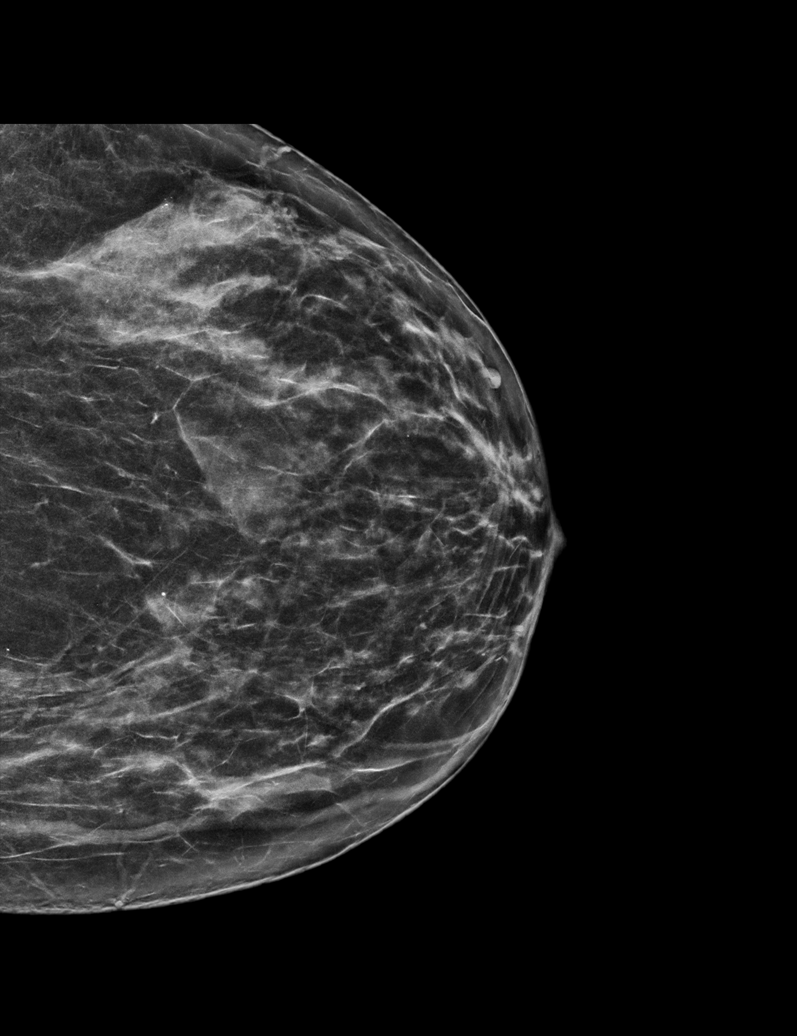

[R CC synth-2D]
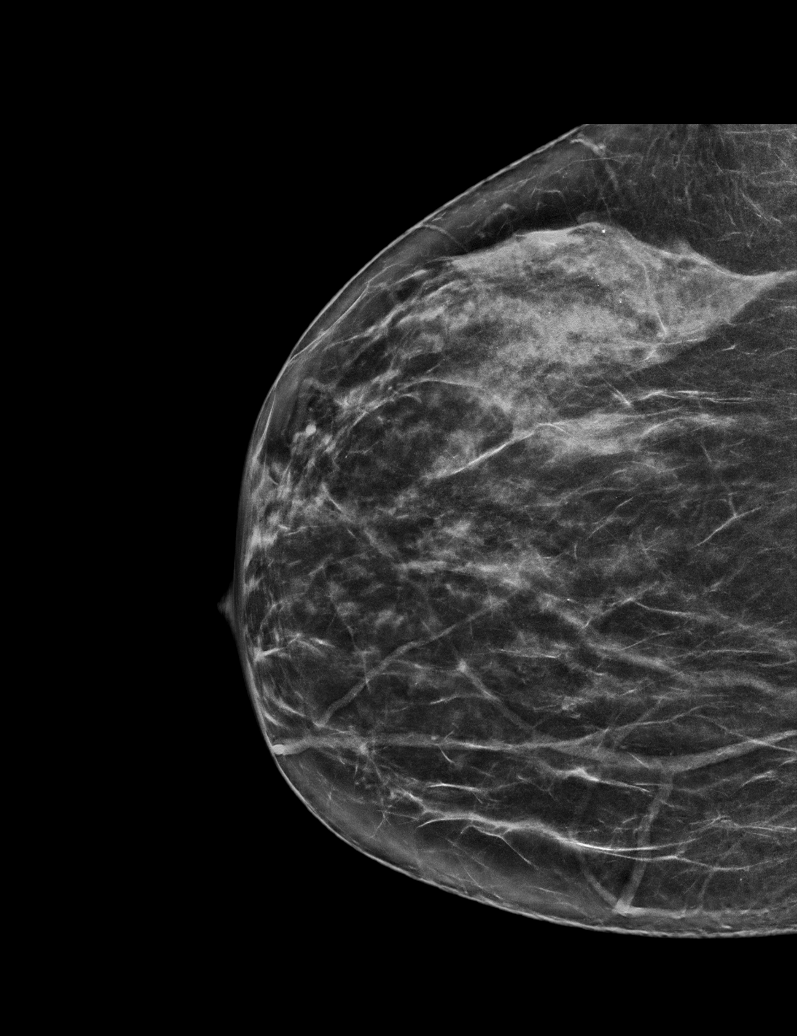

[L MLO synth-2D (1 of 2)]
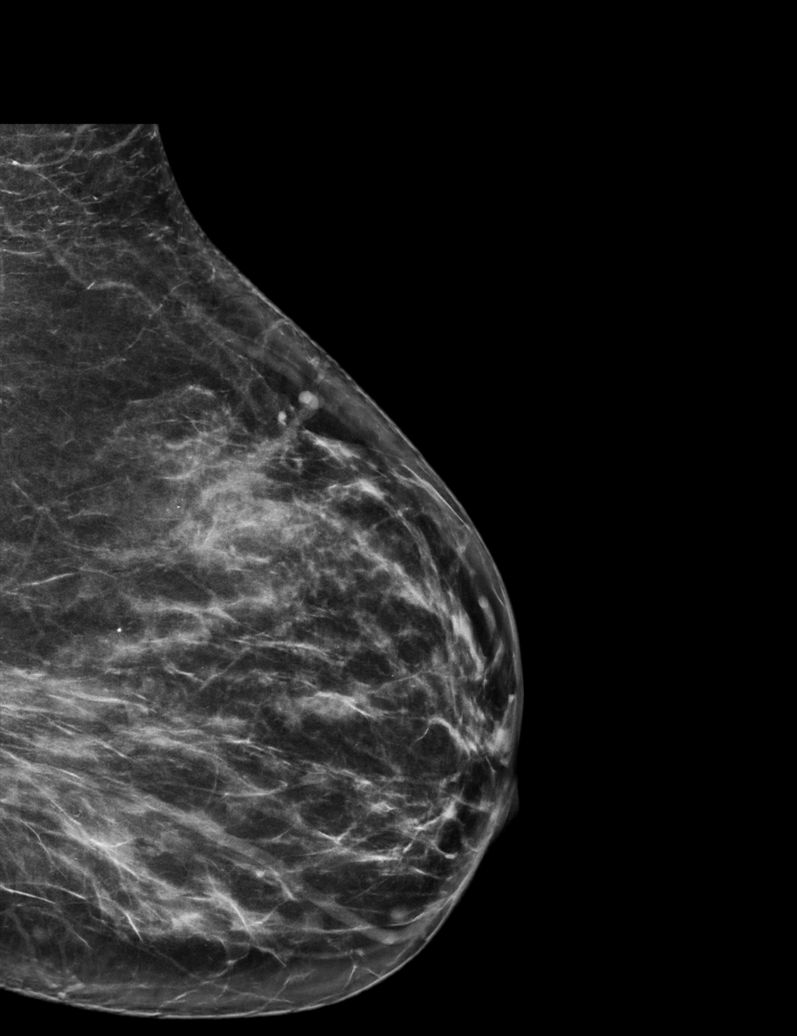

[R MLO synth-2D (1 of 2)]
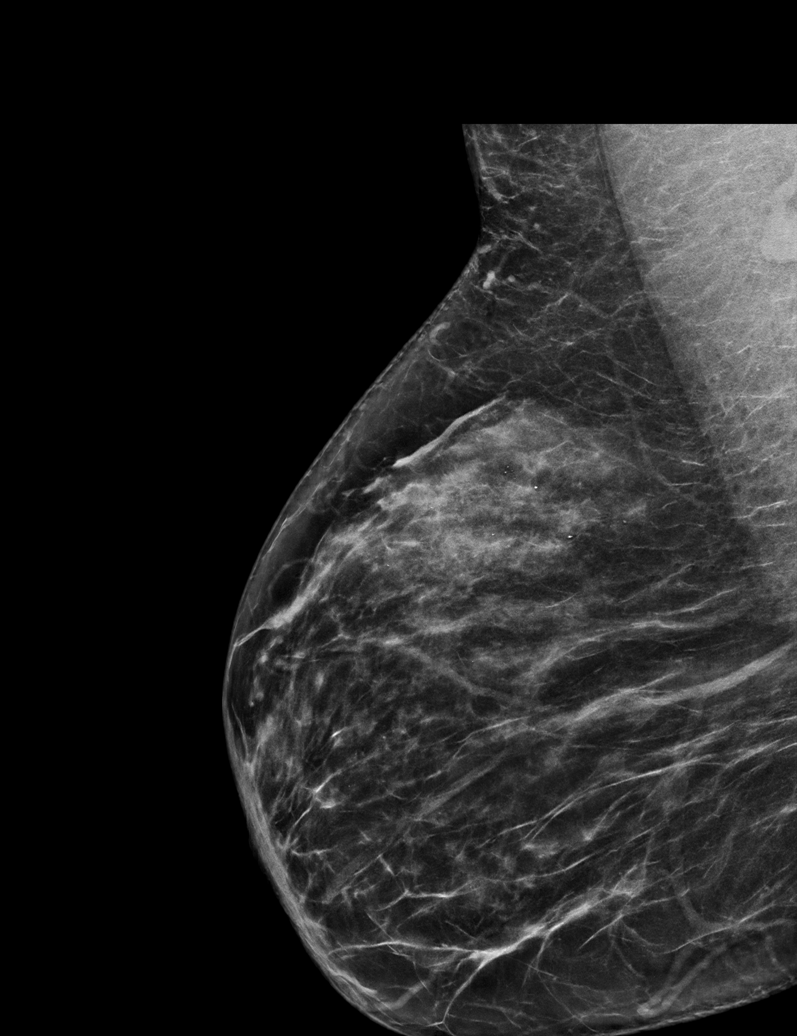

[L MLO synth-2D (2 of 2)]
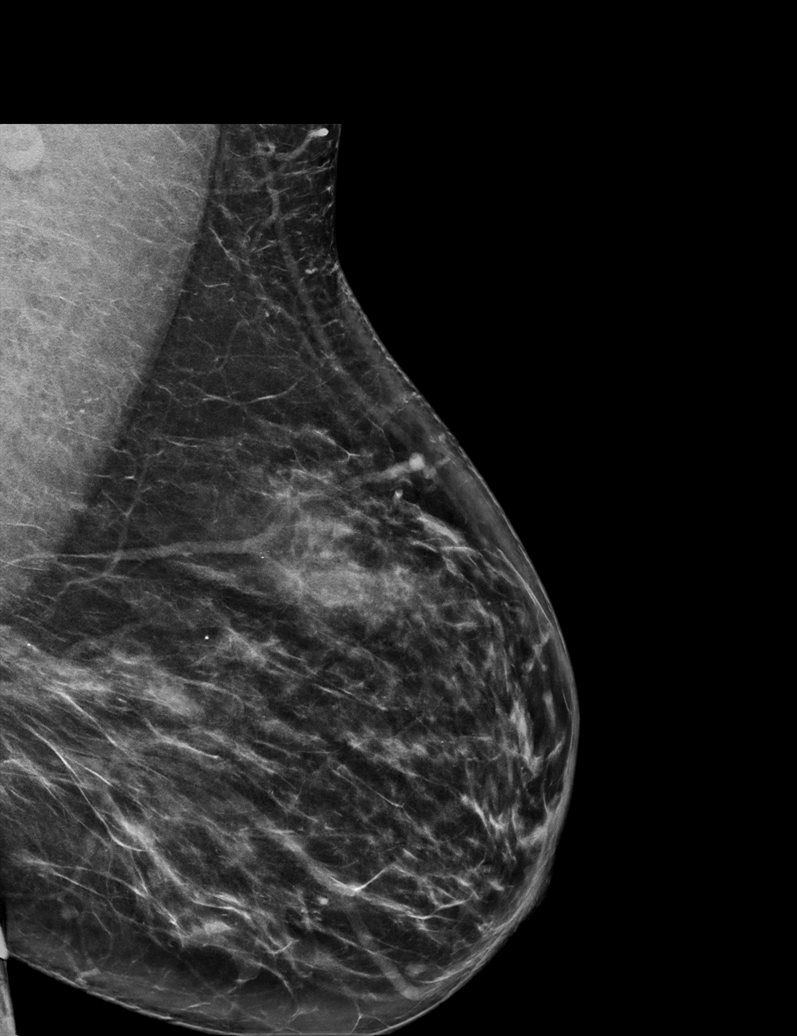

[R MLO synth-2D (2 of 2)]
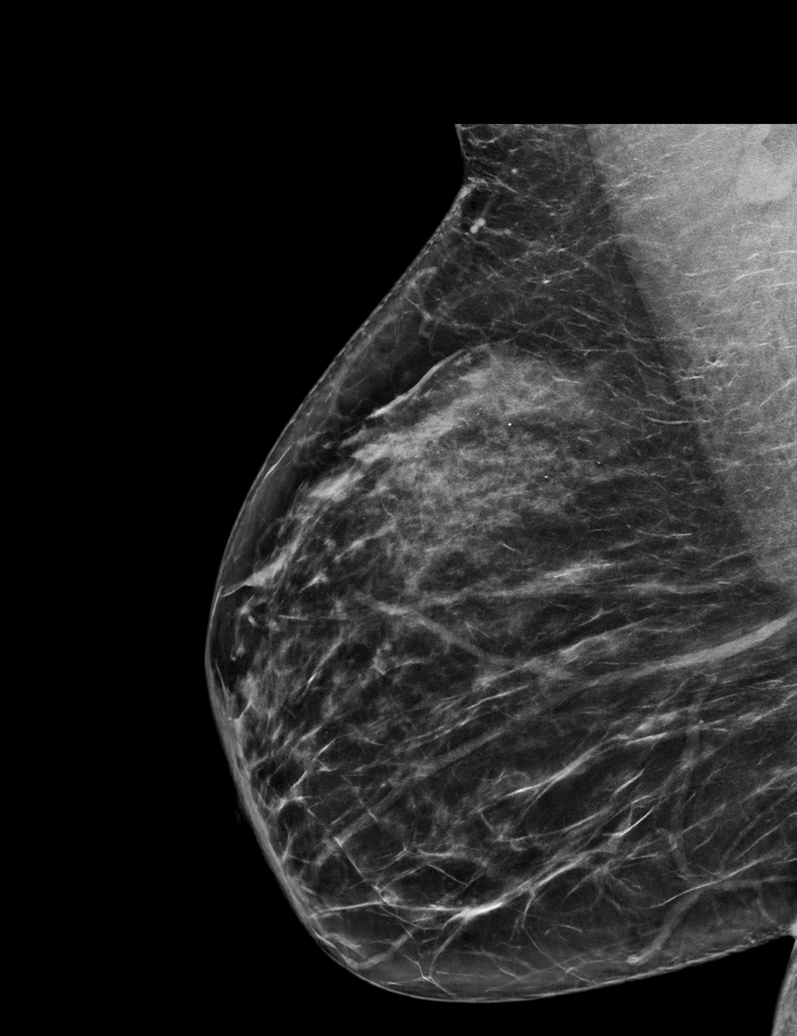

[6 of 36 positions shown; findings below may reference images not displayed]

ACR Breast Density Category c: The breast tissue is heterogeneously
dense, which may obscure small masses.
FINDINGS: There are no findings suspicious for malignancy.
IMPRESSION: No mammographic evidence of malignancy. A result letter of this
screening mammogram will be mailed directly to the patient.

RECOMMENDATION:
Screening mammogram in one year. (Code:Q3-W-BC3)

BI-RADS CATEGORY  1: Negative.

## 2023-02-07 DIAGNOSIS — Z114 Encounter for screening for human immunodeficiency virus [HIV]: Secondary | ICD-10-CM | POA: Diagnosis not present

## 2023-02-07 DIAGNOSIS — Z0000 Encounter for general adult medical examination without abnormal findings: Secondary | ICD-10-CM | POA: Diagnosis not present

## 2023-02-07 DIAGNOSIS — Z13220 Encounter for screening for lipoid disorders: Secondary | ICD-10-CM | POA: Diagnosis not present

## 2023-02-14 DIAGNOSIS — Z0000 Encounter for general adult medical examination without abnormal findings: Secondary | ICD-10-CM | POA: Diagnosis not present

## 2023-02-23 DIAGNOSIS — Z23 Encounter for immunization: Secondary | ICD-10-CM | POA: Diagnosis not present

## 2023-04-02 DIAGNOSIS — G4700 Insomnia, unspecified: Secondary | ICD-10-CM | POA: Diagnosis not present

## 2023-04-02 DIAGNOSIS — F411 Generalized anxiety disorder: Secondary | ICD-10-CM | POA: Diagnosis not present

## 2023-04-02 DIAGNOSIS — F331 Major depressive disorder, recurrent, moderate: Secondary | ICD-10-CM | POA: Diagnosis not present

## 2023-04-04 ENCOUNTER — Telehealth: Payer: Self-pay | Admitting: Diagnostic Neuroimaging

## 2023-04-04 NOTE — Telephone Encounter (Signed)
Received sleep referral from Dr. Duanne Guess for eval for OSA. Placed in sleep referrals box

## 2023-05-17 ENCOUNTER — Encounter: Payer: Self-pay | Admitting: Neurology

## 2023-05-21 ENCOUNTER — Ambulatory Visit (INDEPENDENT_AMBULATORY_CARE_PROVIDER_SITE_OTHER): Payer: BC Managed Care – PPO | Admitting: Neurology

## 2023-05-21 ENCOUNTER — Encounter: Payer: Self-pay | Admitting: Neurology

## 2023-05-21 VITALS — BP 133/86 | HR 68 | Ht 64.0 in | Wt 200.0 lb

## 2023-05-21 DIAGNOSIS — R519 Headache, unspecified: Secondary | ICD-10-CM

## 2023-05-21 DIAGNOSIS — Z820 Family history of epilepsy and other diseases of the nervous system: Secondary | ICD-10-CM

## 2023-05-21 DIAGNOSIS — R0683 Snoring: Secondary | ICD-10-CM

## 2023-05-21 DIAGNOSIS — G4719 Other hypersomnia: Secondary | ICD-10-CM

## 2023-05-21 DIAGNOSIS — E66811 Obesity, class 1: Secondary | ICD-10-CM

## 2023-05-21 DIAGNOSIS — Z9189 Other specified personal risk factors, not elsewhere classified: Secondary | ICD-10-CM | POA: Diagnosis not present

## 2023-05-21 DIAGNOSIS — R351 Nocturia: Secondary | ICD-10-CM

## 2023-05-21 DIAGNOSIS — E669 Obesity, unspecified: Secondary | ICD-10-CM

## 2023-05-21 NOTE — Patient Instructions (Signed)

## 2023-05-21 NOTE — Progress Notes (Addendum)
Subjective:    Patient ID: EMELIE HOUSEY is a 52 y.o. female.  HPI    Huston Foley, MD, PhD [facility] [address], [address] [address] [place], [place] [address]  Dear Cala Bradford,  I saw your patient, Naylanie Marentes, upon your kind request in my sleep clinic today for initial consultation of her sleep disorder, in particular, concern for underlying obstructive sleep apnea.  The patient is unaccompanied today.  As you know, Ms. Begnoche is a 52 year old female with an underlying medical history of hypertension, depression, and obesity, who reports snoring and excessive daytime somnolence.  Her Epworth sleepiness score is 16 out of 24, fatigue severity score is 55 out of 63.  I reviewed your office note from 04/02/2023.  She has woken up with a sense of gasping for air.  She reports a family history of sleep apnea affecting her sister and brother.  Her sister passed away in her 34s after her stroke.  She lives with her significant other and her snoring can be loud and disturbing to him.  Her bedtime is generally between 10 and 11 and rise time between 5 and 6.  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  She has occasional morning headaches and nocturia about once per average night.  She is a non-smoker and drinks alcohol rarely, caffeine in limitation, 1 cup of coffee in the morning and 1 serving of tea per day.  She is working on weight loss, weight has been fluctuating.  She lost weight last year but gained most of it back she reports.  Her Past Medical History Is Significant For: Past Medical History:  Diagnosis Date   Herpes    HTN (hypertension)    MDD (major depressive disorder)    Medical history non-contributory    Vaginal delivery [phone]    Her Past Surgical History Is Significant For: Past Surgical History:  Procedure Laterality Date   ABDOMINAL HYSTERECTOMY N/A 04/07/2014   Procedure: HYSTERECTOMY ABDOMINAL;  Surgeon: Juluis Mire, MD;  Location: [facility];  Service: Gynecology;  Laterality: N/A;   DIAGNOSTIC LAPAROSCOPY     TUBAL LIGATION      Her Family History Is Significant For: Family History  Problem Relation Age of Onset   Cancer Mother    Cancer Father    Sleep apnea Sister    Cancer Brother    Sleep apnea Brother    Cancer Paternal Grandmother    Breast cancer Paternal Grandmother     Her Social History Is Significant For: Social History   Socioeconomic History   Marital status: Single    Spouse name: Not on file   Number of children: Not on file   Years of education: Not on file   Highest education level: Not on file  Occupational History   Occupation: case Production designer, theatre/television/film    Occupation: Software engineer: [id number]  Tobacco Use   Smoking status: Never   Smokeless tobacco: Not on file  Substance and Sexual Activity   Alcohol use: Yes    Comment: socially    Drug use: No   Sexual activity: Not on file  Other Topics Concern   Not on file  Social History Narrative   Lives in [place]    Pets: boxer          Social Determinants of Health   Financial Resource Strain: Not on file  Food Insecurity: Not on file  Transportation Needs: Not on file  Physical Activity:  Not on file  Stress: Not on file  Social Connections: Not on file    Her Allergies Are:  No Known Allergies:   Her Current Medications Are:  Outpatient Encounter Medications as of 05/21/2023  Medication Sig   ALPRAZolam (XANAX) 0.25 MG tablet Take 0.25 mg by mouth daily.   busPIRone (BUSPAR) 5 MG tablet Take 5 mg by mouth 2 (two) times daily.   cholecalciferol (VITAMIN D) 1000 UNITS tablet Take 1,000 Units by mouth daily.   docusate sodium (COLACE) 100 MG capsule Take 100 mg by mouth daily.   ibuprofen (ADVIL) 400 MG tablet Take by mouth.   valsartan (DIOVAN) 40 MG tablet Take 40 mg by mouth daily.   venlafaxine XR (EFFEXOR-XR) 75 MG 24 hr capsule Take 75 mg by mouth daily.   [DISCONTINUED] ferrous sulfate 325 (65 FE) MG EC tablet Take 325  mg by mouth daily.   [DISCONTINUED] folic acid (FOLVITE) 1 MG tablet Take 1 mg by mouth every other day.   [DISCONTINUED] ibuprofen (ADVIL,MOTRIN) 400 MG tablet Take 400 mg by mouth every 6 (six) hours as needed for mild pain.   [DISCONTINUED] loratadine (CLARITIN) 10 MG tablet Take 10 mg by mouth daily. (Patient not taking: Reported on 05/21/2023)   [DISCONTINUED] oxyCODONE-acetaminophen (PERCOCET) 7.5-325 MG per tablet Take 1 tablet by mouth every 4 (four) hours as needed for pain.   No facility-administered encounter medications on file as of 05/21/2023.  :   Review of Systems:  Out of a complete 14 point review of systems, all are reviewed and negative with the exception of these symptoms as listed below:   Review of Systems  Neurological:        Rm 9 alone Pt is well, reports she gets about 6-7 hrs of sleep a night. She has fatigue in morning once she wakes up, which will usually improve throughout the day. Has previously woken up gasping and feeling like she is choking.  Has family history of OSA    Objective:  Neurological Exam  Physical Exam Physical Examination:   Vitals:   05/21/23 0841  BP: 133/86  Pulse: 68    General Examination: The patient is a very pleasant 52 y.o. female in no acute distress. She appears well-developed and well-nourished and well groomed.   (Addendum, 05/21/2023, 15:55): she was on the phone talking to an insurance representative as I walked it and got off the phone call. She was on her phone - not talking - for parts of the visit during the history and discussion, and did apologize for being on the phone, trying to access her insurance information, as she did not have her insurance card with her. She indicated that she was able to multitask and that she was listening.)   HEENT: Normocephalic, atraumatic, pupils are equal, round and reactive to light, extraocular tracking is good without limitation to gaze excursion or nystagmus noted. Hearing is  grossly intact. Face is symmetric with normal facial animation. Speech is clear with no dysarthria noted. There is no hypophonia. There is no lip, neck/head, jaw or voice tremor. Neck is supple with full range of passive and active motion. There are no carotid bruits on auscultation. Oropharynx exam reveals: mild mouth dryness, good dental hygiene, moderate airway crowding secondary to small airway entry, tonsils on the smaller side, Mallampati class II.  Neck circumference 16-1/4 inches.  Mild overbite noted.  Tongue protrudes centrally and palate elevates symmetrically.    Chest: Clear to auscultation without wheezing, rhonchi or  crackles noted.  Heart: S1+S2+0, regular and normal without murmurs, rubs or gallops noted.   Abdomen: Soft, non-tender and non-distended.  Extremities: There is no pitting edema in the distal lower extremities bilaterally.   Skin: Warm and dry without trophic changes noted.   Musculoskeletal: exam reveals no obvious joint deformities.   Neurologically:  Mental status: The patient is awake, alert and oriented in all 4 spheres. Her immediate and remote memory, attention, language skills and fund of knowledge are appropriate. There is no evidence of aphasia, agnosia, apraxia or anomia. Speech is clear with normal prosody and enunciation. Thought process is linear. Mood is normal and affect is normal.  Cranial nerves II - XII are as described above under HEENT exam.  Motor exam: Normal bulk, strength and tone is noted. There is no obvious action or resting tremor.  Fine motor skills and coordination: grossly intact.  Cerebellar testing: No dysmetria or intention tremor. There is no truncal or gait ataxia.  Sensory exam: intact to light touch in the upper and lower extremities.  Gait, station and balance: She stands easily. No veering to one side is noted. No leaning to one side is noted. Posture is age-appropriate and stance is narrow based. Gait shows normal stride  length and normal pace. No problems turning are noted.   Assessment and Plan:  In summary, VIANNI BALICKI is a very pleasant 52 y.o.-year old female with an underlying medical history of hypertension, depression, and obesity, whose history and physical exam are concerning for sleep disordered breathing, particularly obstructive sleep apnea (OSA). While a  laboratory attended sleep study is typically considered "gold standard" for evaluation of sleep disordered breathing we mutually agreed to proceed with a home sleep test at this time.   I had a long chat with the patient about my findings and the diagnosis of sleep apnea, particularly OSA, its prognosis and treatment options. We talked about medical/conservative treatments, surgical interventions and non-pharmacological approaches for symptom control. I explained, in particular, the risks and ramifications of untreated moderate to severe OSA, especially with respect to developing cardiovascular disease down the road, including congestive heart failure (CHF), difficult to treat hypertension, cardiac arrhythmias (particularly A-fib), neurovascular complications including TIA, stroke and dementia. Even type 2 diabetes has, in part, been linked to untreated OSA. Symptoms of untreated OSA may include (but may not be limited to) daytime sleepiness, nocturia (i.e. frequent nighttime urination), memory problems, mood irritability and suboptimally controlled or worsening mood disorder such as depression and/or anxiety, lack of energy, lack of motivation, physical discomfort, as well as recurrent headaches, especially morning or nocturnal headaches. We talked about the importance of maintaining a healthy lifestyle and striving for healthy weight.  I recommended a sleep study at this time. I outlined the differences between a laboratory attended sleep study which is considered more comprehensive and accurate over the option of a home sleep test (HST); the latter may  lead to underestimation of sleep disordered breathing in some instances and does not help with diagnosing upper airway resistance syndrome and is not accurate enough to diagnose primary central sleep apnea typically. I outlined possible surgical and non-surgical treatment options of OSA, including the use of a positive airway pressure (PAP) device (i.e. CPAP, AutoPAP/APAP or BiPAP in certain circumstances), a custom-made dental device (aka oral appliance, which would require a referral to a specialist dentist or orthodontist typically, and is generally speaking not considered for patients with full dentures or edentulous state), upper airway surgical options, such  as traditional UPPP (which is not considered a first-line treatment) or the Inspire device (hypoglossal nerve stimulator, which would involve a referral for consultation with an ENT surgeon, after careful selection, following inclusion criteria - also not first-line treatment). I explained the PAP treatment option to the patient in detail, as this is generally considered first-line treatment.  The patient indicated that she would be willing to try PAP therapy, if the need arises. I explained the importance of being compliant with PAP treatment, not only for insurance purposes but primarily to improve patient's symptoms symptoms, and for the patient's long term health benefit, including to reduce Her cardiovascular risks longer-term.    We will pick up our discussion about the next steps and treatment options after testing.  We will keep her posted as to the test results by phone call and/or MyChart messaging where possible.  We will plan to follow-up in sleep clinic accordingly as well.  I answered all her questions today and the patient was in agreement.   I encouraged her to call with any interim questions, concerns, problems or updates or email [place] through MyChart.  Generally speaking, sleep test authorizations may take up to 2 weeks, sometimes  less, sometimes longer, the patient is encouraged to get in touch with [place] if they do not hear back from the sleep lab staff directly within the next 2 weeks.  Thank you very much for allowing me to participate in the care of this nice patient. If I can be of any further assistance to you please do not hesitate to call me at [phone].  Sincerely,   Huston Foley, MD, PhD

## 2023-05-25 DIAGNOSIS — Z23 Encounter for immunization: Secondary | ICD-10-CM | POA: Diagnosis not present

## 2023-05-29 NOTE — Telephone Encounter (Signed)
HST- [employer] Berkley Harvey: [id number] (exp. 05/29/23 to 07/27/23)

## 2023-07-04 DIAGNOSIS — G4700 Insomnia, unspecified: Secondary | ICD-10-CM | POA: Diagnosis not present

## 2023-07-04 DIAGNOSIS — F411 Generalized anxiety disorder: Secondary | ICD-10-CM | POA: Diagnosis not present

## 2023-07-04 DIAGNOSIS — F331 Major depressive disorder, recurrent, moderate: Secondary | ICD-10-CM | POA: Diagnosis not present

## 2023-07-04 DIAGNOSIS — I10 Essential (primary) hypertension: Secondary | ICD-10-CM | POA: Diagnosis not present

## 2023-07-10 ENCOUNTER — Ambulatory Visit: Payer: BC Managed Care – PPO | Admitting: Neurology

## 2023-07-10 DIAGNOSIS — Z9189 Other specified personal risk factors, not elsewhere classified: Secondary | ICD-10-CM

## 2023-07-10 DIAGNOSIS — G4733 Obstructive sleep apnea (adult) (pediatric): Secondary | ICD-10-CM

## 2023-07-10 DIAGNOSIS — R0683 Snoring: Secondary | ICD-10-CM

## 2023-07-10 DIAGNOSIS — Z820 Family history of epilepsy and other diseases of the nervous system: Secondary | ICD-10-CM

## 2023-07-10 DIAGNOSIS — G4719 Other hypersomnia: Secondary | ICD-10-CM

## 2023-07-10 DIAGNOSIS — R519 Headache, unspecified: Secondary | ICD-10-CM

## 2023-07-10 DIAGNOSIS — E66811 Obesity, class 1: Secondary | ICD-10-CM

## 2023-07-10 DIAGNOSIS — R351 Nocturia: Secondary | ICD-10-CM

## 2023-07-12 NOTE — Addendum Note (Signed)
Addended by: Huston Foley on: 07/12/2023 06:29 PM   Modules accepted: Orders

## 2023-07-12 NOTE — Procedures (Signed)
   [facility]  HOME SLEEP TEST (Watch PAT) REPORT  STUDY DATE: 07/10/23  DOB: 03-14-1971  MRN: [id number]  ORDERING CLINICIAN: Huston Foley, MD, PhD   REFERRING CLINICIAN: Dow Adolph, NP  CLINICAL INFORMATION/HISTORY: 52 year old female with an underlying medical history of hypertension, depression, and obesity, who reports snoring and excessive daytime somnolence.   Epworth sleepiness score: 16/24.  BMI: 34.3 kg/m  FINDINGS:   Sleep Summary:   Total Recording Time (hours, min): 7 hours, 48 min  Total Sleep Time (hours, min):  6 hours, 24 min  Percent REM (%):    8.6%   Respiratory Indices:   Calculated pAHI (per hour):  11.7/hour         REM pAHI:    27.3/hour       NREM pAHI: 10.3/hour  Central pAHI: 0/hour  Oxygen Saturation Statistics:    Oxygen Saturation (%) Mean: 94%   Minimum oxygen saturation (%):                 83%   O2 Saturation Range (%): 83 - 98%    O2 Saturation (minutes) <=88%: 0.7 min  Pulse Rate Statistics:   Pulse Mean (bpm):    83/min    Pulse Range (67 - 112/min)   IMPRESSION: OSA (obstructive sleep apnea), mild  RECOMMENDATION:  This home sleep test demonstrates overall mild obstructive sleep apnea with a total AHI of 11.7/hour and O2 nadir of 83%. Snoring was detected, in the mild to moderate range. Given the patient's medical history and sleep related complaints, therapy with a positive airway pressure device is recommended. Treatment can be achieved in the form of autoPAP trial/titration at home for now. A full night, in-lab PAP titration study may aid in improving proper treatment settings and with mask fit, if needed, down the road. Alternative treatments may include weight loss (where appropriate) along with avoidance of the supine sleep position (if possible), or an oral appliance in appropriate candidates.   Please note that untreated obstructive sleep apnea may carry additional perioperative morbidity.  Patients with significant obstructive sleep apnea should receive perioperative PAP therapy and the surgeons and particularly the anesthesiologist should be informed of the diagnosis and the severity of the sleep disordered breathing. The patient should be cautioned not to drive, work at heights, or operate dangerous or heavy equipment when tired or sleepy. Review and reiteration of good sleep hygiene measures should be pursued with any patient. Other causes of the patient's symptoms, including circadian rhythm disturbances, an underlying mood disorder, medication effect and/or an underlying medical problem cannot be ruled out based on this test. Clinical correlation is recommended.  The patient and her referring provider will be notified of the test results. The patient will be seen in follow up in sleep clinic at [facility], as necessary.  I certify that I have reviewed the raw data recording prior to the issuance of this report in accordance with the standards of the American Academy of Sleep Medicine (AASM).    INTERPRETING PHYSICIAN:   Huston Foley, MD, PhD Medical Director, [facility]) Diplomat, ABPN (Neurology and Sleep)   [facility] [address] [place], [place] [address] [phone]

## 2023-07-16 ENCOUNTER — Telehealth: Payer: Self-pay

## 2023-07-16 NOTE — Telephone Encounter (Signed)
I called pt. I advised pt that Dr. Frances Furbish reviewed their sleep study results and found that pt has mild OSA. Dr. Frances Furbish recommends that pt begin autoPap. I reviewed autoPAP compliance expectations with the pt. Pt is agreeable to starting a CPAP. I advised pt that an order will be sent to a DME, [employer], and they will call the pt within about one week after they file with the pt's insurance. [employer] will show the pt how to use the machine, fit for masks, and troubleshoot the CPAP if needed. A follow up appt was made for insurance purposes with Butch Penny, NP on 10/18/2023. Pt verbalized understanding to arrive 15 minutes early and bring their CPAP. Pt verbalized understanding of results. Pt had no questions at this time but was encouraged to call back if questions arise. I have sent the order to [employer].

## 2023-07-16 NOTE — Telephone Encounter (Signed)
-----   Message from Huston Foley sent at 07/12/2023  6:29 PM EDT ----- Patient referred by PCP, seen by me on 05/21/2023, patient had HST on 07/10/2023.    Please call and notify the patient that the recent home sleep test showed obstructive sleep apnea. OSA is overall mild, but worth treating to see if she feels better after treatment. To that end I recommend treatment in the form of autoPAP, which means, that we don't have to bring her in for a sleep study with CPAP, but will let her try an autoPAP machine at home, through a DME company (of her choice, or as per insurance requirement). The DME representative will educate her on how to use the machine, how to put the mask on, etc. I have placed an order in the chart. Please send referral, talk to patient, send report to referring MD. We will need a FU in sleep clinic in about 2-3 months post-PAP set up (which is usually an insurance-mandated appointment to monitor compliance), please arrange that with me or one of our NPs. Please also go over the need for compliance with treatment (including the insurance-imposed minimum compliance percentage). Thanks,   Huston Foley, MD, PhD [facility])

## 2023-08-15 DIAGNOSIS — E785 Hyperlipidemia, unspecified: Secondary | ICD-10-CM | POA: Diagnosis not present

## 2023-08-15 DIAGNOSIS — E559 Vitamin D deficiency, unspecified: Secondary | ICD-10-CM | POA: Diagnosis not present

## 2023-08-15 DIAGNOSIS — R5383 Other fatigue: Secondary | ICD-10-CM | POA: Diagnosis not present

## 2023-08-22 DIAGNOSIS — E782 Mixed hyperlipidemia: Secondary | ICD-10-CM | POA: Diagnosis not present

## 2023-08-22 DIAGNOSIS — I10 Essential (primary) hypertension: Secondary | ICD-10-CM | POA: Diagnosis not present

## 2023-08-22 DIAGNOSIS — Z789 Other specified health status: Secondary | ICD-10-CM | POA: Diagnosis not present

## 2023-08-22 DIAGNOSIS — E559 Vitamin D deficiency, unspecified: Secondary | ICD-10-CM | POA: Diagnosis not present

## 2023-10-17 ENCOUNTER — Telehealth: Payer: Self-pay | Admitting: Adult Health

## 2023-10-17 NOTE — Telephone Encounter (Signed)
 Noted.  Pt has mild OSA.

## 2023-10-17 NOTE — Telephone Encounter (Signed)
 FYI-Pt called to cx her Initial Cpap appt due to not being interested in using the machine.

## 2023-10-18 ENCOUNTER — Ambulatory Visit: Payer: BC Managed Care – PPO | Admitting: Adult Health

## 2023-10-18 DIAGNOSIS — E785 Hyperlipidemia, unspecified: Secondary | ICD-10-CM | POA: Diagnosis not present

## 2023-10-22 DIAGNOSIS — E782 Mixed hyperlipidemia: Secondary | ICD-10-CM | POA: Diagnosis not present

## 2023-10-22 DIAGNOSIS — R928 Other abnormal and inconclusive findings on diagnostic imaging of breast: Secondary | ICD-10-CM | POA: Diagnosis not present

## 2023-10-22 DIAGNOSIS — I10 Essential (primary) hypertension: Secondary | ICD-10-CM | POA: Diagnosis not present

## 2023-10-22 DIAGNOSIS — Z789 Other specified health status: Secondary | ICD-10-CM | POA: Diagnosis not present

## 2023-12-20 DIAGNOSIS — G4700 Insomnia, unspecified: Secondary | ICD-10-CM | POA: Diagnosis not present

## 2023-12-20 DIAGNOSIS — F331 Major depressive disorder, recurrent, moderate: Secondary | ICD-10-CM | POA: Diagnosis not present

## 2023-12-20 DIAGNOSIS — I10 Essential (primary) hypertension: Secondary | ICD-10-CM | POA: Diagnosis not present

## 2023-12-20 DIAGNOSIS — F411 Generalized anxiety disorder: Secondary | ICD-10-CM | POA: Diagnosis not present

## 2023-12-31 DIAGNOSIS — I10 Essential (primary) hypertension: Secondary | ICD-10-CM | POA: Diagnosis not present

## 2023-12-31 DIAGNOSIS — M542 Cervicalgia: Secondary | ICD-10-CM | POA: Diagnosis not present

## 2023-12-31 DIAGNOSIS — G43109 Migraine with aura, not intractable, without status migrainosus: Secondary | ICD-10-CM | POA: Diagnosis not present

## 2024-02-15 DIAGNOSIS — R5383 Other fatigue: Secondary | ICD-10-CM | POA: Diagnosis not present

## 2024-02-15 DIAGNOSIS — Z13220 Encounter for screening for lipoid disorders: Secondary | ICD-10-CM | POA: Diagnosis not present

## 2024-02-15 DIAGNOSIS — Z0000 Encounter for general adult medical examination without abnormal findings: Secondary | ICD-10-CM | POA: Diagnosis not present

## 2024-02-15 DIAGNOSIS — E559 Vitamin D deficiency, unspecified: Secondary | ICD-10-CM | POA: Diagnosis not present

## 2024-02-18 DIAGNOSIS — Z789 Other specified health status: Secondary | ICD-10-CM | POA: Diagnosis not present

## 2024-02-18 DIAGNOSIS — E559 Vitamin D deficiency, unspecified: Secondary | ICD-10-CM | POA: Diagnosis not present

## 2024-02-18 DIAGNOSIS — I10 Essential (primary) hypertension: Secondary | ICD-10-CM | POA: Diagnosis not present

## 2024-02-18 DIAGNOSIS — E782 Mixed hyperlipidemia: Secondary | ICD-10-CM | POA: Diagnosis not present

## 2024-02-21 ENCOUNTER — Other Ambulatory Visit: Payer: Self-pay | Admitting: Family Medicine

## 2024-02-21 DIAGNOSIS — Z0000 Encounter for general adult medical examination without abnormal findings: Secondary | ICD-10-CM | POA: Diagnosis not present

## 2024-02-21 DIAGNOSIS — Z1231 Encounter for screening mammogram for malignant neoplasm of breast: Secondary | ICD-10-CM

## 2024-03-06 DIAGNOSIS — K5900 Constipation, unspecified: Secondary | ICD-10-CM | POA: Diagnosis not present

## 2024-03-06 DIAGNOSIS — E782 Mixed hyperlipidemia: Secondary | ICD-10-CM | POA: Diagnosis not present

## 2024-03-06 DIAGNOSIS — Z1211 Encounter for screening for malignant neoplasm of colon: Secondary | ICD-10-CM | POA: Diagnosis not present

## 2024-03-06 DIAGNOSIS — I10 Essential (primary) hypertension: Secondary | ICD-10-CM | POA: Diagnosis not present

## 2024-03-07 ENCOUNTER — Ambulatory Visit
Admission: RE | Admit: 2024-03-07 | Discharge: 2024-03-07 | Disposition: A | Discharge status: Home / Self Care | Source: Ambulatory Visit | Attending: Family Medicine | Admitting: Family Medicine

## 2024-03-07 DIAGNOSIS — Z1231 Encounter for screening mammogram for malignant neoplasm of breast: Secondary | ICD-10-CM | POA: Diagnosis not present

## 2024-03-12 ENCOUNTER — Other Ambulatory Visit: Payer: Self-pay | Admitting: Family Medicine

## 2024-03-12 DIAGNOSIS — R928 Other abnormal and inconclusive findings on diagnostic imaging of breast: Secondary | ICD-10-CM

## 2024-03-17 DIAGNOSIS — S91059A Open bite, unspecified ankle, initial encounter: Secondary | ICD-10-CM | POA: Diagnosis not present

## 2024-03-17 DIAGNOSIS — W5501XA Bitten by cat, initial encounter: Secondary | ICD-10-CM | POA: Diagnosis not present

## 2024-03-18 DIAGNOSIS — W5501XD Bitten by cat, subsequent encounter: Secondary | ICD-10-CM | POA: Diagnosis not present

## 2024-03-18 DIAGNOSIS — I10 Essential (primary) hypertension: Secondary | ICD-10-CM | POA: Diagnosis not present

## 2024-03-18 DIAGNOSIS — S91059D Open bite, unspecified ankle, subsequent encounter: Secondary | ICD-10-CM | POA: Diagnosis not present

## 2024-03-20 ENCOUNTER — Other Ambulatory Visit

## 2024-03-20 ENCOUNTER — Encounter

## 2024-03-26 ENCOUNTER — Other Ambulatory Visit

## 2024-03-26 ENCOUNTER — Encounter

## 2024-03-27 ENCOUNTER — Inpatient Hospital Stay
Admission: RE | Admit: 2024-03-27 | Discharge: 2024-03-27 | Discharge status: Home / Self Care | Source: Ambulatory Visit | Attending: Family Medicine | Admitting: Family Medicine

## 2024-03-27 ENCOUNTER — Ambulatory Visit
Admission: RE | Admit: 2024-03-27 | Discharge: 2024-03-27 | Disposition: A | Discharge status: Home / Self Care | Source: Ambulatory Visit | Attending: Family Medicine | Admitting: Family Medicine

## 2024-03-27 DIAGNOSIS — Z23 Encounter for immunization: Secondary | ICD-10-CM | POA: Diagnosis not present

## 2024-03-27 DIAGNOSIS — R928 Other abnormal and inconclusive findings on diagnostic imaging of breast: Secondary | ICD-10-CM

## 2024-03-27 DIAGNOSIS — N6313 Unspecified lump in the right breast, lower outer quadrant: Secondary | ICD-10-CM | POA: Diagnosis not present

## 2024-04-25 DIAGNOSIS — Z1211 Encounter for screening for malignant neoplasm of colon: Secondary | ICD-10-CM | POA: Diagnosis not present

## 2024-04-25 DIAGNOSIS — K529 Noninfective gastroenteritis and colitis, unspecified: Secondary | ICD-10-CM | POA: Diagnosis not present

## 2024-04-25 DIAGNOSIS — K633 Ulcer of intestine: Secondary | ICD-10-CM | POA: Diagnosis not present

## 2024-04-25 DIAGNOSIS — K635 Polyp of colon: Secondary | ICD-10-CM | POA: Diagnosis not present

## 2024-04-25 DIAGNOSIS — K648 Other hemorrhoids: Secondary | ICD-10-CM | POA: Diagnosis not present

## 2024-05-23 DIAGNOSIS — E785 Hyperlipidemia, unspecified: Secondary | ICD-10-CM | POA: Diagnosis not present

## 2024-05-28 DIAGNOSIS — I10 Essential (primary) hypertension: Secondary | ICD-10-CM | POA: Diagnosis not present

## 2024-05-28 DIAGNOSIS — Z23 Encounter for immunization: Secondary | ICD-10-CM | POA: Diagnosis not present

## 2024-05-28 DIAGNOSIS — E782 Mixed hyperlipidemia: Secondary | ICD-10-CM | POA: Diagnosis not present

## 2024-05-28 DIAGNOSIS — Z789 Other specified health status: Secondary | ICD-10-CM | POA: Diagnosis not present

## 2024-05-28 DIAGNOSIS — Z7185 Encounter for immunization safety counseling: Secondary | ICD-10-CM | POA: Diagnosis not present

## 2024-05-28 DIAGNOSIS — J309 Allergic rhinitis, unspecified: Secondary | ICD-10-CM | POA: Diagnosis not present

## 2024-08-12 DIAGNOSIS — E782 Mixed hyperlipidemia: Secondary | ICD-10-CM | POA: Diagnosis not present

## 2024-08-18 DIAGNOSIS — E782 Mixed hyperlipidemia: Secondary | ICD-10-CM | POA: Diagnosis not present

## 2024-08-18 DIAGNOSIS — Z789 Other specified health status: Secondary | ICD-10-CM | POA: Diagnosis not present

## 2024-08-18 DIAGNOSIS — Z79899 Other long term (current) drug therapy: Secondary | ICD-10-CM | POA: Diagnosis not present

## 2024-08-18 DIAGNOSIS — I10 Essential (primary) hypertension: Secondary | ICD-10-CM | POA: Diagnosis not present

## 2024-08-19 ENCOUNTER — Other Ambulatory Visit: Payer: Self-pay | Admitting: Family Medicine

## 2024-08-19 DIAGNOSIS — N63 Unspecified lump in unspecified breast: Secondary | ICD-10-CM

## 2024-08-20 DIAGNOSIS — E1165 Type 2 diabetes mellitus with hyperglycemia: Secondary | ICD-10-CM | POA: Diagnosis not present

## 2024-09-30 ENCOUNTER — Ambulatory Visit
Admission: RE | Admit: 2024-09-30 | Discharge: 2024-09-30 | Disposition: A | Source: Ambulatory Visit | Attending: Family Medicine | Admitting: Family Medicine

## 2024-09-30 ENCOUNTER — Other Ambulatory Visit: Payer: Self-pay | Admitting: Family Medicine

## 2024-09-30 DIAGNOSIS — N63 Unspecified lump in unspecified breast: Secondary | ICD-10-CM

## 2025-03-30 ENCOUNTER — Encounter

## 2025-03-30 ENCOUNTER — Other Ambulatory Visit
